# Patient Record
Sex: Male | Born: 1970 | Race: White | Hispanic: No | Marital: Single | Smoking: Never smoker
Health system: Southern US, Community
[De-identification: ages and names within clinical notes are randomized; demographics above are authoritative.]

## PROBLEM LIST (undated history)

## (undated) DIAGNOSIS — E785 Hyperlipidemia, unspecified: Secondary | ICD-10-CM

## (undated) DIAGNOSIS — S069XAA Unspecified intracranial injury with loss of consciousness status unknown, initial encounter: Secondary | ICD-10-CM

## (undated) DIAGNOSIS — M6281 Muscle weakness (generalized): Secondary | ICD-10-CM

## (undated) DIAGNOSIS — R061 Stridor: Secondary | ICD-10-CM

## (undated) DIAGNOSIS — I10 Essential (primary) hypertension: Secondary | ICD-10-CM

## (undated) DIAGNOSIS — F419 Anxiety disorder, unspecified: Secondary | ICD-10-CM

## (undated) DIAGNOSIS — S069X9A Unspecified intracranial injury with loss of consciousness of unspecified duration, initial encounter: Secondary | ICD-10-CM

## (undated) DIAGNOSIS — R1310 Dysphagia, unspecified: Secondary | ICD-10-CM

## (undated) DIAGNOSIS — J386 Stenosis of larynx: Secondary | ICD-10-CM

## (undated) HISTORY — PX: TRACHEOSTOMY: SUR1362

---

## 2016-07-04 ENCOUNTER — Encounter (HOSPITAL_COMMUNITY): Payer: Self-pay

## 2016-07-04 ENCOUNTER — Emergency Department (HOSPITAL_COMMUNITY)
Admission: EM | Admit: 2016-07-04 | Discharge: 2016-07-05 | Disposition: A | Discharge status: Home / Self Care | Payer: Medicare Other | Attending: Emergency Medicine | Admitting: Emergency Medicine

## 2016-07-04 DIAGNOSIS — J9509 Other tracheostomy complication: Secondary | ICD-10-CM | POA: Diagnosis present

## 2016-07-04 DIAGNOSIS — Z430 Encounter for attention to tracheostomy: Secondary | ICD-10-CM | POA: Insufficient documentation

## 2016-07-04 NOTE — ED Notes (Signed)
Bed: [id number] Expected date:  Expected time:  Means of arrival:  Comments: EMS trach irritation

## 2016-07-04 NOTE — ED Notes (Signed)
Per Respiratory, patient wants a new trach tonight

## 2016-07-04 NOTE — ED Triage Notes (Signed)
Patient arrives by EMS ambulatory in stable condition, verbal with no cough while speaking. [facility], called EMS because he has been coughing for 2 days and his trach is irritating him-previous trach for 3 years, this trach was placed in [place] 3 months ago. EMS states staff told patient that his trach was in the proper position.

## 2016-07-04 NOTE — ED Triage Notes (Signed)
EMS states patient was outside waiting for them

## 2016-07-04 NOTE — ED Provider Notes (Addendum)
[facility]-EMERGENCY DEPT Provider Note   CSN: [id number] Arrival date & time: 07/04/16  2150   By signing my name below, I, Theodore Knox, attest that this documentation has been prepared under the direction and in the presence of Tomasita CrumbleAdeleke Ghina Bittinger, MD. Electronically Signed: Soijett Knox, ED Scribe. 07/04/16. 11:28 PM.  History   Chief Complaint Chief Complaint  Patient presents with  . Trach Irritation    HPI Theodore Knox is a 46 y.o. male who presents to the Emergency Department complaining of trach irritation onset PTA. Pt reports associated cough x 2 days. Pt has not tried any medications for the relief of his symptoms. Pt notes that he had sensation of irritation and malalignment to his tracheostomy tube site due to his persistent cough and attempted to loosen the tracheostomy tube neck cuff PTA. He states that he came into the ED for tracheostomy tube replacement tonight. Pt reports that he had his tracheostomy tube placed 30 days ago and it is time for the tube to be replaced. Pt denies prior issues with his tracheostomy tube or having an ENT specialist in [place] due to recently moving to the area. He denies fever, chills, and any other symptoms.    The history is provided by the patient. No language interpreter was used.    History reviewed. No pertinent past medical history.  There are no active problems to display for this patient.   Past Surgical History:  Procedure Laterality Date  . TRACHEOSTOMY         Home Medications    Prior to Admission medications   Not on File    Family History No family history on file.  Social History Social History  Substance Use Topics  . Smoking status: Never Smoker  . Smokeless tobacco: Never Used  . Alcohol use No     Allergies   Patient has no known allergies.   Review of Systems Review of Systems A complete 10 system review of systems was obtained and all systems are negative except as noted in the HPI and PMH.     Physical Exam Updated Vital Signs BP 134/88 (BP Location: Left Arm)   Pulse 97   Temp 98.5 F (36.9 C) (Oral)   Resp 20   Ht 5\' 7"  (1.702 m)   Wt 190 lb (86.2 kg)   SpO2 96%   BMI 29.76 kg/m   Physical Exam  Constitutional: He is oriented to person, place, and time. Vital signs are normal. He appears well-developed and well-nourished.  Non-toxic appearance. He does not appear ill. No distress.  HENT:  Head: Normocephalic and atraumatic.  Nose: Nose normal.  Mouth/Throat: Oropharynx is clear and moist. No oropharyngeal exudate.  Eyes: Conjunctivae and EOM are normal. Pupils are equal, round, and reactive to light. No scleral icterus.  Neck: Normal range of motion. Neck supple. No tracheal deviation, no edema, no erythema and normal range of motion present. No thyroid mass and no thyromegaly present.  Trach in place. No surrounding erythema or signs of infection.  Cardiovascular: Normal rate, regular rhythm, S1 normal, S2 normal, normal heart sounds, intact distal pulses and normal pulses.  Exam reveals no gallop and no friction rub.   No murmur heard. Pulmonary/Chest: Effort normal. No respiratory distress. He has no wheezes. He has rhonchi. He has no rales.  Bilateral mild intermittent rhonchi  Abdominal: Soft. Normal appearance and bowel sounds are normal. He exhibits no distension, no ascites and no mass. There is no hepatosplenomegaly. There is no tenderness.  There is no rebound, no guarding and no CVA tenderness.  Musculoskeletal: Normal range of motion. He exhibits no edema or tenderness.  Lymphadenopathy:    He has no cervical adenopathy.  Neurological: He is alert and oriented to person, place, and time. He has normal strength. No cranial nerve deficit or sensory deficit.  Skin: Skin is warm, dry and intact. No petechiae and no rash noted. He is not diaphoretic. No erythema. No pallor.  Nursing note and vitals reviewed.    ED Treatments / Results  DIAGNOSTIC  STUDIES: Oxygen Saturation is 96% on RA, nl by my interpretation.    COORDINATION OF CARE: 11:20 PM Discussed treatment plan with pt at bedside which includes tracheostomy change and pt agreed to plan.   Radiology No results found.  Procedures TRACHEOSTOMY REPLACEMENT Date/Time: 07/26/2016 6:59 AM Performed by: Tomasita Crumble Authorized by: Tomasita Crumble  Consent: Verbal consent obtained. Risks and benefits: risks, benefits and alternatives were discussed Consent given by: patient Patient understanding: patient states understanding of the procedure being performed Procedure consent: procedure consent matches procedure scheduled Relevant documents: relevant documents present and verified Test results: test results available and properly labeled Site marked: the operative site was marked Imaging studies: imaging studies not available Patient identity confirmed: verbally with patient Indications: malfunction Local anesthesia used: no  Anesthesia: Local anesthesia used: no  Sedation: Patient sedated: no Patient tolerance: Patient tolerated the procedure well with no immediate complications    (including critical care time)  Medications Ordered in ED Medications - No data to display   Initial Impression / Assessment and Plan / ED Course  I have reviewed the triage vital signs and the nursing notes.  Pertinent imaging results that were available during my care of the patient were reviewed by me and considered in my medical decision making (see chart for details).     Patient presents to the ED for tracheostomy change.  He states he has a 30 day trach and it needs changing around this time. He does not have an ENT and is requesting a referral.  Will provide him one and attempt to change in the ED if respiratory care has the proper equipment.  Otherwise, he appears well and in NAD or resp distress.  VS have been normal.   12:30 AM Janina Mayo has been changed. Follow up provided.  Patient safe for DC.    Final Clinical Impressions(s) / ED Diagnoses   Final diagnoses:  None    New Prescriptions New Prescriptions   No medications on file      I personally performed the services described in this documentation, which was scribed in my presence. The recorded information has been reviewed and is accurate.      Tomasita Crumble, MD 07/05/16 4098    Tomasita Crumble, MD 07/26/16 0700

## 2016-07-05 DIAGNOSIS — Z430 Encounter for attention to tracheostomy: Secondary | ICD-10-CM | POA: Diagnosis not present

## 2016-07-05 NOTE — Procedures (Signed)
Bedside Tracheostomy Insertion Procedure Note   Patient Details:   Name: Theodore Knox DOB: 09/08/1970 MRN: [id number]  Procedure: Tracheostomy  Pre Procedure Assessment: Breath Sounds: Clear  Post Procedure Assessment: BP 134/88 (BP Location: Left Arm)   Pulse 97   Temp 98.5 F (36.9 C) (Oral)   Resp 20   Ht 5\' 7"  (1.702 m)   Wt 190 lb (86.2 kg)   SpO2 96%   BMI 29.76 kg/m  O2 sats: stable throughout Complications: No apparent complications Patient did tolerate procedure well Tracheostomy Brand:Shiley Tracheostomy Style:Proximal Tracheostomy Size: 6.0 XLT Proximal Tracheostomy Secured HYQ:MVHQIOvia:Velcro Tracheostomy Placement Confirmation: EZ CAP EtCO2 detector and visualization    Rulon EisenmengerJones, Cyndee Giammarco K 07/05/2016, 12:32 AM

## 2016-07-05 NOTE — ED Notes (Signed)
Respiratory called to change trach

## 2016-07-05 NOTE — ED Notes (Signed)
[facility] called for transportation home and [facility] given to [facility]

## 2016-07-23 ENCOUNTER — Emergency Department (HOSPITAL_COMMUNITY)
Admission: EM | Admit: 2016-07-23 | Discharge: 2016-07-23 | Disposition: A | Discharge status: Home / Self Care | Payer: Medicare Other | Attending: Emergency Medicine | Admitting: Emergency Medicine

## 2016-07-23 ENCOUNTER — Encounter (HOSPITAL_COMMUNITY): Payer: Self-pay | Admitting: Emergency Medicine

## 2016-07-23 DIAGNOSIS — J9500 Unspecified tracheostomy complication: Secondary | ICD-10-CM

## 2016-07-23 NOTE — ED Provider Notes (Signed)
[facility]-EMERGENCY DEPT Provider Note   CSN: [id number] Arrival date & time: 07/23/16  1050     History   Chief Complaint Chief Complaint  Patient presents with  . Tracheostomy Tube Change    HPI Warnie Belair is a 46 y.o. male. Chief complaint is tracheostomy difficulties. HPI:  Vision presents for evaluation of difficulties with his tracheostomy. He was cleaning it today. It had some congealed secretions. He is attempted to clean it. The inner cannula became wrinkled and is unable to replace it and presents here.  History reviewed. No pertinent past medical history.  There are no active problems to display for this patient.   Past Surgical History:  Procedure Laterality Date  . TRACHEOSTOMY         Home Medications    Prior to Admission medications   Not on File    Family History No family history on file.  Social History Social History  Substance Use Topics  . Smoking status: Never Smoker  . Smokeless tobacco: Never Used  . Alcohol use No     Allergies   Patient has no known allergies.   Review of Systems Review of Systems  Constitutional: Negative for appetite change, chills, diaphoresis, fatigue and fever.  HENT: Negative for mouth sores, sore throat and trouble swallowing.   Eyes: Negative for visual disturbance.  Respiratory: Negative for cough, chest tightness, shortness of breath and wheezing.        Normal secretions with his tracheostomy. Difficulty replacing the cannula.  Cardiovascular: Negative for chest pain.  Gastrointestinal: Negative for abdominal distention, abdominal pain, diarrhea, nausea and vomiting.  Endocrine: Negative for polydipsia, polyphagia and polyuria.  Genitourinary: Negative for dysuria, frequency and hematuria.  Musculoskeletal: Negative for gait problem.  Skin: Negative for color change, pallor and rash.  Neurological: Negative for dizziness, syncope, light-headedness and headaches.  Hematological: Does not  bruise/bleed easily.  Psychiatric/Behavioral: Negative for behavioral problems and confusion.     Physical Exam Updated Vital Signs BP 116/78 (BP Location: Left Arm)   Pulse 66   Temp 98.3 F (36.8 C) (Oral)   Resp 18   SpO2 92%   Physical Exam  Constitutional: He is oriented to person, place, and time. He appears well-developed and well-nourished. No distress.  HENT:  Head: Normocephalic.  Tracheostomy site appears intact.  Eyes: Conjunctivae are normal. Pupils are equal, round, and reactive to light. No scleral icterus.  Neck: Normal range of motion. Neck supple. No thyromegaly present.  Cardiovascular: Normal rate and regular rhythm.  Exam reveals no gallop and no friction rub.   No murmur heard. Pulmonary/Chest: Effort normal and breath sounds normal. No respiratory distress. He has no wheezes. He has no rales.  Abdominal: Soft. Bowel sounds are normal. He exhibits no distension. There is no tenderness. There is no rebound.  Musculoskeletal: Normal range of motion.  Neurological: He is alert and oriented to person, place, and time.  Skin: Skin is warm and dry. No rash noted.  Psychiatric: He has a normal mood and affect. His behavior is normal.     ED Treatments / Results  Labs (all labs ordered are listed, but only abnormal results are displayed) Labs Reviewed - No data to display  EKG  EKG Interpretation None       Radiology No results found.  Procedures Procedures (including critical care time)  Medications Ordered in ED Medications - No data to display   Initial Impression / Assessment and Plan / ED Course  I have reviewed  the triage vital signs and the nursing notes.  Pertinent labs & imaging results that were available during my care of the patient were reviewed by me and considered in my medical decision making (see chart for details).    Tracheostomy suctioned and inner cannula placed without difficulty. Appropriate for discharge home.  Final  Clinical Impressions(s) / ED Diagnoses   Final diagnoses:  Complication of tracheostomy tube [facility](HCC)    New Prescriptions New Prescriptions   No medications on file     Rolland PorterMark Mak Bonny, MD 07/23/16 1225

## 2016-07-23 NOTE — ED Triage Notes (Signed)
Patient presents today with needing a new inner Trach canula. Patient reports its a (6) XL shiely. Patient states he was changing at home and "ran into issues". Patient denies any Chest pain. Patient states he has SOB due to the canula. Patient alert and oriented x4. Patient ambulatory.

## 2016-08-07 ENCOUNTER — Other Ambulatory Visit (HOSPITAL_COMMUNITY): Payer: Self-pay | Admitting: Nurse Practitioner

## 2016-08-09 ENCOUNTER — Other Ambulatory Visit (HOSPITAL_COMMUNITY): Payer: Self-pay | Admitting: Registered Nurse

## 2016-08-09 DIAGNOSIS — R1310 Dysphagia, unspecified: Secondary | ICD-10-CM

## 2016-08-13 ENCOUNTER — Emergency Department (HOSPITAL_COMMUNITY): Payer: Medicare Other

## 2016-08-13 ENCOUNTER — Emergency Department (HOSPITAL_COMMUNITY)
Admission: EM | Admit: 2016-08-13 | Discharge: 2016-08-13 | Disposition: A | Discharge status: Home / Self Care | Payer: Medicare Other | Attending: Emergency Medicine | Admitting: Emergency Medicine

## 2016-08-13 ENCOUNTER — Encounter (HOSPITAL_COMMUNITY): Payer: Self-pay | Admitting: Emergency Medicine

## 2016-08-13 DIAGNOSIS — J9509 Other tracheostomy complication: Secondary | ICD-10-CM | POA: Insufficient documentation

## 2016-08-13 DIAGNOSIS — J9500 Unspecified tracheostomy complication: Secondary | ICD-10-CM

## 2016-08-13 MED ORDER — CLOMIPRAMINE HCL 25 MG PO CAPS
50.0000 mg | ORAL_CAPSULE | Freq: Once | ORAL | Status: AC
  Administered 2016-08-13: 50 mg via ORAL
  Filled 2016-08-13: qty 2

## 2016-08-13 MED ORDER — LIDOCAINE HCL 2 % EX GEL
1.0000 "application " | Freq: Once | CUTANEOUS | Status: AC
  Administered 2016-08-13: 1 via TOPICAL
  Filled 2016-08-13: qty 20

## 2016-08-13 MED ORDER — OLANZAPINE 5 MG PO TABS
5.0000 mg | ORAL_TABLET | Freq: Every day | ORAL | Status: DC
  Administered 2016-08-13: 5 mg via ORAL
  Filled 2016-08-13 (×2): qty 1

## 2016-08-13 NOTE — ED Notes (Signed)
Report given to nursing staff at [facility]; staff states to send patient home in taxi

## 2016-08-13 NOTE — ED Provider Notes (Signed)
[facility]-EMERGENCY DEPT Provider Note   CSN: [id number] Arrival date & time: 08/13/16  1625     History   Chief Complaint Chief Complaint  Patient presents with  . Tracheostomy Tube Change    HPI Quantay Zaremba is a 46 y.o. male.  HPI 45 year old male with history of tracheostomy placed in December 2017 here with dislodgment of endotracheal tube. Patient was just seen last month for similar symptoms. He states he was eating today when he accidentally choked and coughed forcefully. The endotracheal tube was then removed accidentally. He attempted to replace it but was unable to do so. Currently, he denies any shortness of breath. Denies any pain or redness around his tracheostomy site. Denies any recent drainage. He was in his usual state of health prior to the accidental dislodgment.  History reviewed. No pertinent past medical history.  There are no active problems to display for this patient.   Past Surgical History:  Procedure Laterality Date  . TRACHEOSTOMY         Home Medications    Prior to Admission medications   Medication Sig Start Date End Date Taking? Authorizing Provider  acetaminophen (TYLENOL) 500 MG tablet Take 1,000 mg by mouth 2 (two) times daily as needed (for pain, headaches, or fever).   Yes Historical Provider, MD  ARIPiprazole (ABILIFY) 15 MG tablet Take 15 mg by mouth daily.   Yes Historical Provider, MD  atorvastatin (LIPITOR) 40 MG tablet Take 40 mg by mouth daily.   Yes Historical Provider, MD  clomiPRAMINE (ANAFRANIL) 50 MG capsule Take 100 mg by mouth daily.   Yes Historical Provider, MD  diphenhydrAMINE (BENADRYL) 25 mg capsule Take 25 mg by mouth at bedtime as needed for sleep.   Yes Historical Provider, MD  lithium carbonate 300 MG capsule Take 300 mg by mouth every morning.   Yes Historical Provider, MD  Melatonin 3 MG TABS Take 6 mg by mouth at bedtime.   Yes Historical Provider, MD  metoprolol succinate (TOPROL-XL) 100 MG 24 hr tablet Take 100  mg by mouth daily. Take with or immediately following a meal.   Yes Historical Provider, MD  nystatin cream (MYCOSTATIN) Apply 1 application topically See admin instructions. TO BE APPLIED TO THE AFFECTED AREA(S) DAILY   Yes Historical Provider, MD  OLANZapine (ZYPREXA) 5 MG tablet Take 5 mg by mouth 2 (two) times daily.   Yes Historical Provider, MD  Omega-3 Fatty Acids (FISH OIL) 1000 MG CAPS Take 1,000 mg by mouth daily.   Yes Historical Provider, MD  oxybutynin (DITROPAN) 5 MG tablet Take 5 mg by mouth daily.   Yes Historical Provider, MD    Family History No family history on file.  Social History Social History  Substance Use Topics  . Smoking status: Never Smoker  . Smokeless tobacco: Never Used  . Alcohol use No     Allergies   Patient has no known allergies.   Review of Systems Review of Systems  Constitutional: Negative for chills, fatigue and fever.  HENT: Negative for congestion and rhinorrhea.   Eyes: Negative for visual disturbance.  Respiratory: Negative for cough, shortness of breath and wheezing.   Cardiovascular: Negative for chest pain and leg swelling.  Gastrointestinal: Negative for abdominal pain, diarrhea, nausea and vomiting.  Genitourinary: Negative for dysuria and flank pain.  Musculoskeletal: Negative for neck pain and neck stiffness.  Skin: Negative for rash and wound.  Allergic/Immunologic: Negative for immunocompromised state.  Neurological: Negative for syncope, weakness and headaches.  All  other systems reviewed and are negative.    Physical Exam Updated Vital Signs BP (!) 129/95   Pulse 87   Temp 98.5 F (36.9 C) (Oral)   Resp 18   SpO2 93%   Physical Exam  Constitutional: He is oriented to person, place, and time. He appears well-developed and well-nourished. No distress.  HENT:  Head: Normocephalic and atraumatic.  Eyes: Conjunctivae are normal.  Neck: Neck supple.  Tracheostomy site pink and patent. There is a small area of  granulation tissue at approximately 10 to 11:00. No active bleeding. No stridor. Phonation is hoarse but audible.  Cardiovascular: Normal rate, regular rhythm and normal heart sounds.  Exam reveals no friction rub.   No murmur heard. Pulmonary/Chest: Effort normal and breath sounds normal. No respiratory distress. He has no wheezes. He has no rales.  Abdominal: He exhibits no distension.  Musculoskeletal: He exhibits no edema.  Neurological: He is alert and oriented to person, place, and time. He exhibits normal muscle tone.  Skin: Skin is warm. Capillary refill takes less than 2 seconds.  Psychiatric: He has a normal mood and affect.  Nursing note and vitals reviewed.    ED Treatments / Results  Labs (all labs ordered are listed, but only abnormal results are displayed) Labs Reviewed - No data to display  EKG  EKG Interpretation None       Radiology Dg Chest 2 View  Result Date: 08/13/2016 CLINICAL DATA:  Coughing today while heating and tracheostomy came out. Dr. put Tracheostomy back in place but patient states it is not smaller. EXAM: CHEST  2 VIEW COMPARISON:  None. FINDINGS: Tracheostomy appears in adequate position. Lungs are adequately inflated without focal consolidation or effusion. Cardiomediastinal silhouette is within normal. There mild degenerate changes of the spine. IMPRESSION: No acute cardiopulmonary disease. Tracheostomy tube in adequate position. Electronically Signed   By: Elberta Fortis M.D.   On: 08/13/2016 21:51    Procedures Procedures (including critical care time)  Medications Ordered in ED Medications  lidocaine (XYLOCAINE) 2 % jelly 1 application (1 application Topical Given 08/13/16 1807)  clomiPRAMINE (ANAFRANIL) capsule 50 mg (50 mg Oral Given 08/13/16 2307)     Initial Impression / Assessment and Plan / ED Course  I have reviewed the triage vital signs and the nursing notes.  Pertinent labs & imaging results that were available during my care of  the patient were reviewed by me and considered in my medical decision making (see chart for details).     46 year old male here with accidental dislodgment of tracheostomy tube. Unable to replace with 6-0 and 4-0 at bedside by myself and RT. Consulted Dr. Pollyann Kennedy with ENT, who dilated tracheostomy at bedside and was able to place 4-0 cuffless Shiley. Pt well appearing, tolerating PO, with normal WOB after placement. XR unremarkable. D/c with outpt follow-up. Updated pt, pt's mother, and will send back to ALF.  Final Clinical Impressions(s) / ED Diagnoses   Final diagnoses:  Complication of tracheostomy tube [facility])    New Prescriptions Discharge Medication List as of 08/13/2016 10:44 PM       Shaune Pollack, MD 08/14/16 [phone]

## 2016-08-13 NOTE — Discharge Instructions (Signed)
-  Trach tube was replaced with a 4-0 Shiley. Theodore Knox needs to follow up with Dr. Pollyann Kennedy in 1 week. -He is due for his melatonin. He has been given his other night-time medications.

## 2016-08-13 NOTE — ED Notes (Signed)
Pt in room completely dressed; pt requesting results of CXR and doses of nightly medications

## 2016-08-13 NOTE — ED Notes (Signed)
Pt out of room several time to nursing station asking for nightly doses of medications

## 2016-08-13 NOTE — ED Notes (Signed)
Secretary reqeusted pt trach size from the central supply.

## 2016-08-13 NOTE — Consult Note (Signed)
  Reason for Consult: Tracheostomy fell out Referring Physician: Shaune Pollack, MD  Theodore Knox is an 46 y.o. male.  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 As far as he knows he does not have any other endoscopic treatment. Etiology of the stenosis is unknown. He had some trouble with choking on food earlier and his tracheostomy came out. Nobody was able to put it back in.  History reviewed. No pertinent past medical history.  Past Surgical History:  Procedure Laterality Date  . TRACHEOSTOMY      No family history on file.  Social History:  reports that he has never smoked. He has never used smokeless tobacco. He reports that he does not drink alcohol or use drugs.  Allergies: No Known Allergies  Medications: Reviewed  No results found for this or any previous visit (from the past 48 hour(s)).  No results found.  WUJ:[id number] except as listed in admit H&P  Blood pressure 129/73, pulse 79, temperature 98.5 F (36.9 C), temperature source Oral, resp. rate 20, SpO2 94 %.  PHYSICAL EXAM: Overall appearance:  Healthy appearing, in no distress Head:  Normocephalic, atraumatic. Ears: External ears look healthy. Nose: External nose is healthy in appearance. Internal nasal exam free of any lesions or obstruction. Oral Cavity/Pharynx:  There are no mucosal lesions or masses identified. Larynx/Hypopharynx: Deferred Neuro:  No identifiable neurologic deficits. Neck: No palpable neck masses. Tracheostomy site stenotic. I was able to dilate with urethral sounds and then was able to replace with a #4 uncuffed Shiley. There was minor bleeding. He tolerated this well but had some coughing.  Studies Reviewed: none  Procedures: Tracheostomy was replaced after dilating the stoma.   Assessment/Plan: Subglottic stenosis, tracheostomy dependent, tracheostomy replaced with a #4 Shiley uncuffed. Recommend we keep this in for  about a week and let everything settle down and then we will see him in the office to discuss further intervention.  Theodore Knox 08/13/2016, 8:02 PM

## 2016-08-13 NOTE — ED Triage Notes (Addendum)
Received pt from [facility] with c/o 1 hour PTA, while eating some chocolate candy,  pt coughed and his trach came out. Pt able to talk in complete sentences. Pt uses shiley 6 XLT. Pt reports cuffless trach will not work

## 2016-08-14 ENCOUNTER — Ambulatory Visit (HOSPITAL_COMMUNITY)
Admission: RE | Admit: 2016-08-14 | Discharge: 2016-08-14 | Disposition: A | Discharge status: Home / Self Care | Payer: Medicare Other | Source: Ambulatory Visit | Attending: Registered Nurse | Admitting: Registered Nurse

## 2016-08-14 DIAGNOSIS — R1310 Dysphagia, unspecified: Secondary | ICD-10-CM | POA: Diagnosis present

## 2016-08-24 ENCOUNTER — Emergency Department (HOSPITAL_COMMUNITY)
Admission: EM | Admit: 2016-08-24 | Discharge: 2016-08-24 | Disposition: A | Discharge status: Home / Self Care | Payer: Medicare Other | Attending: Emergency Medicine | Admitting: Emergency Medicine

## 2016-08-24 ENCOUNTER — Encounter (HOSPITAL_COMMUNITY): Payer: Self-pay | Admitting: Emergency Medicine

## 2016-08-24 DIAGNOSIS — Z79899 Other long term (current) drug therapy: Secondary | ICD-10-CM | POA: Diagnosis not present

## 2016-08-24 DIAGNOSIS — S3992XA Unspecified injury of lower back, initial encounter: Secondary | ICD-10-CM | POA: Diagnosis present

## 2016-08-24 DIAGNOSIS — Y939 Activity, unspecified: Secondary | ICD-10-CM | POA: Insufficient documentation

## 2016-08-24 DIAGNOSIS — X58XXXA Exposure to other specified factors, initial encounter: Secondary | ICD-10-CM | POA: Insufficient documentation

## 2016-08-24 DIAGNOSIS — S39012A Strain of muscle, fascia and tendon of lower back, initial encounter: Secondary | ICD-10-CM

## 2016-08-24 DIAGNOSIS — I10 Essential (primary) hypertension: Secondary | ICD-10-CM | POA: Diagnosis not present

## 2016-08-24 DIAGNOSIS — Y999 Unspecified external cause status: Secondary | ICD-10-CM | POA: Insufficient documentation

## 2016-08-24 DIAGNOSIS — M545 Low back pain, unspecified: Secondary | ICD-10-CM

## 2016-08-24 DIAGNOSIS — Y929 Unspecified place or not applicable: Secondary | ICD-10-CM | POA: Insufficient documentation

## 2016-08-24 HISTORY — DX: Essential (primary) hypertension: I10

## 2016-08-24 MED ORDER — KETOROLAC TROMETHAMINE 60 MG/2ML IM SOLN
60.0000 mg | Freq: Once | INTRAMUSCULAR | Status: AC
  Administered 2016-08-24: 60 mg via INTRAMUSCULAR
  Filled 2016-08-24: qty 2

## 2016-08-24 MED ORDER — METHOCARBAMOL 500 MG PO TABS
500.0000 mg | ORAL_TABLET | Freq: Once | ORAL | Status: AC
  Administered 2016-08-24: 500 mg via ORAL
  Filled 2016-08-24: qty 1

## 2016-08-24 MED ORDER — METHOCARBAMOL 500 MG PO TABS: 500.0000 mg | ORAL_TABLET | Freq: Three times a day (TID) | ORAL | 0 refills | Status: DC | PRN

## 2016-08-24 MED ORDER — OXYCODONE-ACETAMINOPHEN 5-325 MG PO TABS
1.0000 | ORAL_TABLET | Freq: Once | ORAL | Status: AC
  Administered 2016-08-24: 1 via ORAL
  Filled 2016-08-24: qty 1

## 2016-08-24 MED ORDER — IBUPROFEN 600 MG PO TABS: 600.0000 mg | ORAL_TABLET | Freq: Three times a day (TID) | ORAL | 0 refills | Status: DC | PRN

## 2016-08-24 NOTE — ED Triage Notes (Addendum)
Patient from home ([facility]) for lower back pain that was present on waking this morning.  Patient states he believes it is related to unusual exertion yesterday sitting and walking more than normal.  Patient moves all extremities without difficulty, but states it is too painful to stand and walk.  EMS reports patient was able to stand and pivot on scene, patient is ambulatory at baseline.  Tracheostomy in place and patent.  Patient alert and oriented and in no apparent distress at this time.  Patient states he took  tylenol at 0730 prior to EMS arrival that changed his pain from 10/10 to 6/10.
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
## 2016-09-11 ENCOUNTER — Emergency Department (HOSPITAL_COMMUNITY)
Admission: EM | Admit: 2016-09-11 | Discharge: 2016-09-11 | Disposition: A | Discharge status: Home / Self Care | Payer: Medicare Other | Attending: Emergency Medicine | Admitting: Emergency Medicine

## 2016-09-11 ENCOUNTER — Encounter (HOSPITAL_COMMUNITY): Payer: Self-pay | Admitting: Emergency Medicine

## 2016-09-11 DIAGNOSIS — I10 Essential (primary) hypertension: Secondary | ICD-10-CM | POA: Insufficient documentation

## 2016-09-11 DIAGNOSIS — Z79899 Other long term (current) drug therapy: Secondary | ICD-10-CM | POA: Insufficient documentation

## 2016-09-11 DIAGNOSIS — J9500 Unspecified tracheostomy complication: Secondary | ICD-10-CM | POA: Insufficient documentation

## 2016-09-11 HISTORY — DX: Hyperlipidemia, unspecified: E78.5

## 2016-09-11 HISTORY — DX: Anxiety disorder, unspecified: F41.9

## 2016-09-11 NOTE — ED Notes (Signed)
Called for [facility] transport. 

## 2016-09-11 NOTE — ED Notes (Signed)
Per ER staff, patient left ER with [facility].

## 2016-09-11 NOTE — ED Notes (Signed)
Went over discharge paperwork with patient and his mother.  Both verbalized understanding of discharge instructions.

## 2016-09-11 NOTE — ED Triage Notes (Signed)
Per [facility] pt from [facility] [place] requesting trach to be changed.

## 2016-09-11 NOTE — ED Notes (Signed)
Bed: [facility] Expected date:  Expected time:  Means of arrival:  Comments: 

## 2016-09-11 NOTE — ED Provider Notes (Signed)
[facility]-EMERGENCY DEPT Provider Note   CSN: [id number] Arrival date & time: 09/11/16  1409     History   Chief Complaint Chief Complaint  Patient presents with  . Trach Changed    HPI Theodore Knox is a 46 y.o. male.  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  He presents emergency department today because a person in the community and noted that the patient had some redness around his trach and recommended they come to the ER for evaluation.  Patient has no complaints at this time.  He denies shortness of breath.  He reports no cough with his trach.  He states he cannot visualize the area and therefore he came to [place] to have it evaluated.  No new secretions.  No fevers or chills.  No other complaints.  Of note he was seen in emergency department the beginning of April after his 6-0 Shiley uncuffed came out and this was unable to be replaced by the staff in the emergency department.  Dr. Pollyann Kennedy with ENT came in to see and evaluate the patient dilated his stoma and placed a 4-0 Shiley.  The patient was supposed to follow-up with Dr. Pollyann Kennedy in the clinic in one week and he has never followed up.  He currently does not have anyone managing his trach as this was originally placed [facility]   Past Medical History:  Diagnosis Date  . Anxiety   . Hyperlipemia   . Hypertension     There are no active problems to display for this patient.   Past Surgical History:  Procedure Laterality Date  . TRACHEOSTOMY         Home Medications    Prior to Admission medications   Medication Sig Start Date End Date Taking? Authorizing Provider  acetaminophen (TYLENOL) 500 MG tablet Take 1,000 mg by mouth 2 (two) times daily as needed (for pain, headaches, or fever).    [provider]  ARIPiprazole (ABILIFY) 15 MG tablet Take 15 mg by mouth daily.    [provider]  atorvastatin (LIPITOR) 40 MG tablet Take 40 mg by mouth daily.    [provider]  clomiPRAMINE (ANAFRANIL) 50 MG capsule Take 100 mg by mouth daily.    [provider]  diphenhydrAMINE (BENADRYL) 25 mg capsule Take 25 mg by mouth at bedtime as needed for sleep.    [provider]  ibuprofen (ADVIL,MOTRIN) 600 MG tablet Take 1 tablet (600 mg total) by mouth every 8 (eight) hours as needed. 08/24/16   [name], MD  lithium carbonate 300 MG capsule Take 300 mg by mouth every morning.    [provider]  Melatonin 3 MG TABS Take 6 mg by mouth at bedtime.    [provider]  methocarbamol (ROBAXIN) 500 MG tablet Take 1 tablet (500 mg total) by mouth every 8 (eight) hours as needed for muscle spasms. 08/24/16   [name], MD  metoprolol succinate (TOPROL-XL) 100 MG 24 hr tablet Take 100 mg by mouth daily. Take with or immediately following a meal.    [provider]  nystatin cream (MYCOSTATIN) Apply 1 application topically See admin instructions. TO BE APPLIED TO THE AFFECTED AREA(S) DAILY    [provider]  OLANZapine (ZYPREXA) 5 MG tablet Take 5 mg by mouth 2 (two) times daily.    [provider]  Omega-3 Fatty Acids (FISH OIL) 1000 MG CAPS Take 1,000 mg by mouth daily.    [provider]  oxybutynin (DITROPAN) 5 MG tablet Take 5 mg by mouth daily.    [provider]    Family History No family history on file.  Social History Social History  Substance Use Topics  . Smoking status: Never Smoker  . Smokeless tobacco: Never Used  . Alcohol use No     Allergies   Patient has no known allergies.   Review of Systems Review of Systems  All other systems reviewed and are negative.    Physical Exam Updated Vital Signs BP 132/85 (BP Location: Right Arm)   Pulse 99   Temp 98.4 F (36.9 C) (Oral)   Resp 18   Wt 190 lb (86.2 kg)   SpO2 93%   BMI 29.76 kg/m   Physical Exam  Constitutional: He is oriented to person, place, and time. He appears well-developed and  well-nourished.  HENT:  Head: Normocephalic.  Eyes: EOM are normal.  Neck: Normal range of motion. Neck supple.  4-0 Shiley uncuffed trach in place.  No significant surrounding erythema or discharge  Pulmonary/Chest: Effort normal.  Abdominal: He exhibits no distension.  Musculoskeletal: Normal range of motion.  Neurological: He is alert and oriented to person, place, and time.  Psychiatric: He has a normal mood and affect.  Nursing note and vitals reviewed.    ED Treatments / Results  Labs (all labs ordered are listed, but only abnormal results are displayed) Labs Reviewed - No data to display  EKG  EKG Interpretation None       Radiology No results found.  Procedures Procedures (including critical care time)  Medications Ordered in ED Medications - No data to display   Initial Impression / Assessment and Plan / ED Course  I have reviewed the triage vital signs and the nursing notes.  Pertinent labs & imaging results that were available during my care of the patient were reviewed by me and considered in my medical decision making (see chart for details).     No indication for exchange of his tracheostomy today.  No overt signs of surrounding infection or tracheitis.  Patient will be discharged home safely from the emergency department this time.  I've asked that he follow-up with ENT as originally instructed  Final Clinical Impressions(s) / ED Diagnoses   Final diagnoses:  Tracheostomy complication, unspecified complication type [facility](HCC)    New Prescriptions New Prescriptions   No medications on file     Azalia Bilisampos, Tamiki Kuba, MD 09/11/16 [phone]

## 2016-09-13 ENCOUNTER — Encounter (HOSPITAL_COMMUNITY): Payer: Self-pay | Admitting: *Deleted

## 2016-09-13 NOTE — Pre-Procedure Instructions (Signed)
    Theodore Knox  09/13/2016     No Pharmacies Listed   Your procedure is scheduled on Thursday, Sep 14, 2016  [facility] at 11:20 A.M.  Call this number if you have problems the morning of surgery:  [phone]   Remember:  Do not eat food or drink liquids after midnight.  Take these medicines the morning of surgery with A SIP OF WATER :ARIPiprazole (ABILIFY), clomiPRAMINE (ANAFRANIL), lithium carbonate, metoprolol succinate (TOPROL-XL), OLANZapine (ZYPREXA) , if needed: acetaminophen (TYLENOL)   Stop taking Aspirin, vitamins, fish oil, Melatonin and herbal medications. Do not take any NSAIDs ie: Ibuprofen, Advil, Naproxen, BC and Goody Powder or any medication containing Aspirin; stop now.   Do not wear jewelry, make-up or nail polish.  Do not wear lotions, powders, or perfumes, or deoderant.  Do not shave 48 hours prior to surgery.  Men may shave face and neck.  Do not bring valuables to the hospital.  [facility] is not responsible for any belongings or valuables.  Contacts, dentures or bridgework may not be worn into surgery.  Leave your suitcase in the car.  After surgery it may be brought to your room.  For patients admitted to the hospital, discharge time will be determined by your treatment team.  Patients discharged the day of surgery will not be allowed to drive home.   Please read over the following fact sheets that you were given.

## 2016-09-13 NOTE — Progress Notes (Signed)
Pt SDW-Pre-op call completed by Lurena Joinerebecca, Patient Care Director at [facility] [place]. Please complete Anesthesia Assessment, Apnea Screening and Cardiac history with pt on DOS. Requested [facility] tracing from [facility].

## 2016-09-13 NOTE — Progress Notes (Signed)
Lurena JoinerRebecca confirmed receipt of fax and verbalized understanding of all pre-op instructions.

## 2016-09-14 ENCOUNTER — Ambulatory Visit (HOSPITAL_COMMUNITY): Payer: Medicare Other | Admitting: Certified Registered Nurse Anesthetist

## 2016-09-14 ENCOUNTER — Encounter (HOSPITAL_COMMUNITY): Payer: Self-pay | Admitting: *Deleted

## 2016-09-14 ENCOUNTER — Ambulatory Visit (HOSPITAL_COMMUNITY)
Admission: RE | Admit: 2016-09-14 | Discharge: 2016-09-14 | Disposition: A | Discharge status: Home / Self Care | Payer: Medicare Other | Source: Ambulatory Visit | Attending: Otolaryngology | Admitting: Otolaryngology

## 2016-09-14 ENCOUNTER — Encounter (HOSPITAL_COMMUNITY)
Admission: RE | Disposition: A | Discharge status: Home / Self Care | Payer: Self-pay | Source: Ambulatory Visit | Attending: Otolaryngology

## 2016-09-14 DIAGNOSIS — Z930 Tracheostomy status: Secondary | ICD-10-CM | POA: Diagnosis not present

## 2016-09-14 DIAGNOSIS — Z79899 Other long term (current) drug therapy: Secondary | ICD-10-CM | POA: Insufficient documentation

## 2016-09-14 DIAGNOSIS — I10 Essential (primary) hypertension: Secondary | ICD-10-CM | POA: Diagnosis not present

## 2016-09-14 DIAGNOSIS — E785 Hyperlipidemia, unspecified: Secondary | ICD-10-CM | POA: Insufficient documentation

## 2016-09-14 DIAGNOSIS — J398 Other specified diseases of upper respiratory tract: Secondary | ICD-10-CM | POA: Diagnosis not present

## 2016-09-14 HISTORY — DX: Stridor: R06.1

## 2016-09-14 HISTORY — DX: Unspecified intracranial injury with loss of consciousness status unknown, initial encounter: S06.9XAA

## 2016-09-14 HISTORY — DX: Unspecified intracranial injury with loss of consciousness of unspecified duration, initial encounter: S06.9X9A

## 2016-09-14 HISTORY — DX: Muscle weakness (generalized): M62.81

## 2016-09-14 HISTORY — PX: TRACHEAL DILITATION: SHX5068

## 2016-09-14 HISTORY — DX: Stenosis of larynx: J38.6

## 2016-09-14 HISTORY — DX: Dysphagia, unspecified: R13.10

## 2016-09-14 LAB — CBC
HCT: 45.7 % (ref 39.0–52.0)
HEMOGLOBIN: 15.4 g/dL (ref 13.0–17.0)
MCH: 29.5 pg (ref 26.0–34.0)
MCHC: 33.7 g/dL (ref 30.0–36.0)
MCV: 87.5 fL (ref 78.0–100.0)
Platelets: 184 10*3/uL (ref 150–400)
RBC: 5.22 MIL/uL (ref 4.22–5.81)
RDW: 12.3 % (ref 11.5–15.5)
WBC: 8 10*3/uL (ref 4.0–10.5)

## 2016-09-14 LAB — BASIC METABOLIC PANEL
Anion gap: 11 (ref 5–15)
BUN: 11 mg/dL (ref 6–20)
CHLORIDE: 105 mmol/L (ref 101–111)
CO2: 24 mmol/L (ref 22–32)
Calcium: 9.2 mg/dL (ref 8.9–10.3)
Creatinine, Ser: 0.76 mg/dL (ref 0.61–1.24)
GFR calc non Af Amer: >60 mL/min (ref >60)
Glucose, Bld: 81 mg/dL (ref 65–99)
POTASSIUM: 3.9 mmol/L (ref 3.5–5.1)
SODIUM: 140 mmol/L (ref 135–145)

## 2016-09-14 SURGERY — DILATION, TRACHEA
Anesthesia: General | Site: Neck

## 2016-09-14 MED ORDER — FENTANYL CITRATE (PF) 250 MCG/5ML IJ SOLN
INTRAMUSCULAR | Status: AC
  Filled 2016-09-14: qty 5

## 2016-09-14 MED ORDER — PROPOFOL 10 MG/ML IV BOLUS
INTRAVENOUS | Status: DC | PRN
  Administered 2016-09-14: 70 mg via INTRAVENOUS
  Administered 2016-09-14: 130 mg via INTRAVENOUS

## 2016-09-14 MED ORDER — MIDAZOLAM HCL 2 MG/2ML IJ SOLN
INTRAMUSCULAR | Status: AC
  Filled 2016-09-14: qty 2

## 2016-09-14 MED ORDER — FENTANYL CITRATE (PF) 100 MCG/2ML IJ SOLN
INTRAMUSCULAR | Status: DC | PRN
  Administered 2016-09-14: 50 ug via INTRAVENOUS

## 2016-09-14 MED ORDER — LACTATED RINGERS IV SOLN
INTRAVENOUS | Status: DC
  Administered 2016-09-14: 10 mL/h via INTRAVENOUS

## 2016-09-14 MED ORDER — HYDROMORPHONE HCL 1 MG/ML IJ SOLN: 0.2500 mg | INTRAMUSCULAR | Status: DC | PRN

## 2016-09-14 MED ORDER — ONDANSETRON HCL 4 MG/2ML IJ SOLN: 4.0000 mg | Freq: Once | INTRAMUSCULAR | Status: DC | PRN

## 2016-09-14 MED ORDER — MEPERIDINE HCL 25 MG/ML IJ SOLN: 6.2500 mg | INTRAMUSCULAR | Status: DC | PRN

## 2016-09-14 MED ORDER — 0.9 % SODIUM CHLORIDE (POUR BTL) OPTIME
TOPICAL | Status: DC | PRN
  Administered 2016-09-14: 1000 mL

## 2016-09-14 MED ORDER — PROPOFOL 500 MG/50ML IV EMUL
INTRAVENOUS | Status: DC | PRN
  Administered 2016-09-14: 200 ug/kg/min via INTRAVENOUS

## 2016-09-14 MED ORDER — ROCURONIUM BROMIDE 10 MG/ML (PF) SYRINGE
PREFILLED_SYRINGE | INTRAVENOUS | Status: AC
  Filled 2016-09-14: qty 5

## 2016-09-14 MED ORDER — LIDOCAINE 2% (20 MG/ML) 5 ML SYRINGE
INTRAMUSCULAR | Status: AC
  Filled 2016-09-14: qty 5

## 2016-09-14 MED ORDER — MIDAZOLAM HCL 5 MG/5ML IJ SOLN
INTRAMUSCULAR | Status: DC | PRN
  Administered 2016-09-14: 2 mg via INTRAVENOUS

## 2016-09-14 MED ORDER — LIDOCAINE-EPINEPHRINE 1 %-1:100000 IJ SOLN
INTRAMUSCULAR | Status: AC
  Filled 2016-09-14: qty 1

## 2016-09-14 MED ORDER — PROPOFOL 10 MG/ML IV BOLUS
INTRAVENOUS | Status: AC
  Filled 2016-09-14: qty 20

## 2016-09-14 SURGICAL SUPPLY — 37 items
BENZOIN TINCTURE PRP APPL 2/3 (GAUZE/BANDAGES/DRESSINGS) IMPLANT
BLADE CLIPPER SURG (BLADE) IMPLANT
BLADE SURG 15 STRL LF DISP TIS (BLADE) ×1 IMPLANT
BLADE SURG 15 STRL SS (BLADE) ×2
CANISTER SUCT 3000ML PPV (MISCELLANEOUS) ×3 IMPLANT
CLEANER TIP ELECTROSURG 2X2 (MISCELLANEOUS) ×3 IMPLANT
COVER SURGICAL LIGHT HANDLE (MISCELLANEOUS) ×3 IMPLANT
DECANTER SPIKE VIAL GLASS SM (MISCELLANEOUS) IMPLANT
DRAPE HALF SHEET 40X57 (DRAPES) IMPLANT
ELECT COATED BLADE 2.86 ST (ELECTRODE) ×3 IMPLANT
ELECT REM PT RETURN 9FT ADLT (ELECTROSURGICAL) ×3
ELECTRODE REM PT RTRN 9FT ADLT (ELECTROSURGICAL) ×1 IMPLANT
GAUZE SPONGE 4X4 16PLY XRAY LF (GAUZE/BANDAGES/DRESSINGS) ×3 IMPLANT
GLOVE ECLIPSE 7.5 STRL STRAW (GLOVE) ×3 IMPLANT
GLOVE SURG SS PI 6.5 STRL IVOR (GLOVE) ×3 IMPLANT
GOWN STRL REUS W/ TWL LRG LVL3 (GOWN DISPOSABLE) ×2 IMPLANT
GOWN STRL REUS W/TWL LRG LVL3 (GOWN DISPOSABLE) ×4
KIT BASIN OR (CUSTOM PROCEDURE TRAY) ×3 IMPLANT
KIT ROOM TURNOVER OR (KITS) ×3 IMPLANT
NEEDLE PRECISIONGLIDE 27X1.5 (NEEDLE) ×3 IMPLANT
NS IRRIG 1000ML POUR BTL (IV SOLUTION) ×3 IMPLANT
PACK EENT II TURBAN DRAPE (CUSTOM PROCEDURE TRAY) ×3 IMPLANT
PAD ARMBOARD 7.5X6 YLW CONV (MISCELLANEOUS) ×6 IMPLANT
PENCIL FOOT CONTROL (ELECTRODE) ×3 IMPLANT
SUT CHROMIC 2 0 SH (SUTURE) IMPLANT
SUT ETHILON 3 0 PS 1 (SUTURE) IMPLANT
SUT SILK 4 0 (SUTURE)
SUT SILK 4 0 TIE 10X30 (SUTURE) IMPLANT
SUT SILK 4-0 18XBRD TIE 12 (SUTURE) IMPLANT
SYR 20CC LL (SYRINGE) ×3 IMPLANT
SYR CONTROL 10ML LL (SYRINGE) IMPLANT
TOWEL OR 17X24 6PK STRL BLUE (TOWEL DISPOSABLE) ×3 IMPLANT
TUBE CONNECTING 12'X1/4 (SUCTIONS) ×1
TUBE CONNECTING 12X1/4 (SUCTIONS) ×2 IMPLANT
TUBE TRACH 6 EXL  PROX UNCUF (TUBING) ×2
TUBE TRACH 6 EXL PROX UNCUF (TUBING) ×1 IMPLANT
WATER STERILE IRR 1000ML POUR (IV SOLUTION) ×3 IMPLANT

## 2016-09-14 NOTE — Transfer of Care (Signed)
Immediate Anesthesia Transfer of Care Note  Patient: Theodore Knox  Procedure(s) Performed: Procedure(s): TRACHEAL DILITATION (N/A)  Patient Location: PACU  Anesthesia Type:MAC  Level of Consciousness: awake, alert , oriented and patient cooperative  Airway & Oxygen Therapy: Patient Spontanous Breathing  With face mask on blow by Post-op Assessment: Report given to RN and Post -op Vital signs reviewed and stable  Post vital signs: Reviewed and stable  Last Vitals:  Vitals:   09/14/16 1203 09/14/16 1415  BP: (!) 133/93 (!) (P) 128/94  Pulse: 60   Resp: 20   Temp: 36.8 C (P) 36.5 C    Last Pain:  Vitals:   09/14/16 1203  TempSrc: Oral      Patients Stated Pain Goal: 2 (45/85/92 9244)  Complications: No apparent anesthesia complications

## 2016-09-14 NOTE — OR Nursing (Signed)
Obturator to PACU with patient.

## 2016-09-14 NOTE — Op Note (Signed)
OPERATIVE REPORT  DATE OF SURGERY: 09/14/2016  PATIENT:  Theodore Knox,  46 y.o. male  PRE-OPERATIVE DIAGNOSIS:  LARNGEAL STENOSIS  POST-OPERATIVE DIAGNOSIS:  * No post-op diagnosis entered *  PROCEDURE:  Procedure(s): TRACHEAL DILITATION  SURGEON:  Susy FrizzleJefry H Chaitanya Amedee, MD  ASSISTANTS: None  ANESTHESIA:   Intravenous sedation  EBL:  0 ml  DRAINS: None  LOCAL MEDICATIONS USED:  None  SPECIMEN:  none  COUNTS:  Correct  PROCEDURE DETAILS: The patient was taken to the operating room and placed on the operating table in the supine position. Following induction of intravenous propofol the patient anesthesia, the neck was draped in a standard fashion. The 4 Shiley uncuffed trach tube was removed. A #6 Shiley XLT was then inserted without difficulty. There is no need for any dilation or any other treatment. Velcro straps were used to secure this in place. Patient was transferred to PACU in stable condition.    PATIENT DISPOSITION:  To PACU, stable
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
## 2016-09-14 NOTE — Progress Notes (Signed)
Report given to maryann sahver rn as caregiver 

## 2016-09-14 NOTE — Anesthesia Preprocedure Evaluation (Signed)
Anesthesia Evaluation  Patient identified by MRN, date of birth, ID band Patient awake    Reviewed: Allergy & Precautions, NPO status , Patient's Chart, lab work & pertinent test results  Airway Mallampati: Trach       Dental   Pulmonary    Pulmonary exam normal        Cardiovascular hypertension, Pt. on medications Normal cardiovascular exam     Neuro/Psych Anxiety    GI/Hepatic   Endo/Other    Renal/GU      Musculoskeletal   Abdominal   Peds  Hematology   Anesthesia Other Findings   Reproductive/Obstetrics                             Anesthesia Physical Anesthesia Plan  ASA: III  Anesthesia Plan: General   Post-op Pain Management:    Induction: Intravenous  Airway Management Planned: Tracheostomy  Additional Equipment:   Intra-op Plan:   Post-operative Plan:   Informed Consent: I have reviewed the patients History and Physical, chart, labs and discussed the procedure including the risks, benefits and alternatives for the proposed anesthesia with the patient or authorized representative who has indicated his/her understanding and acceptance.     Plan Discussed with: CRNA and Surgeon  Anesthesia Plan Comments:         Anesthesia Quick Evaluation

## 2016-09-14 NOTE — Discharge Instructions (Signed)
Resume normal activity

## 2016-09-15 ENCOUNTER — Encounter (HOSPITAL_COMMUNITY): Payer: Self-pay | Admitting: Otolaryngology

## 2016-09-15 NOTE — Anesthesia Postprocedure Evaluation (Addendum)
Anesthesia Post Note  Patient: Theodore Knox  Procedure(s) Performed: Procedure(s) (LRB): TRACHEAL DILITATION; UPSIZE TRACH (N/A)  Patient location during evaluation: PACU Anesthesia Type: General Level of consciousness: awake and alert Pain management: pain level controlled Vital Signs Assessment: post-procedure vital signs reviewed and stable Respiratory status: spontaneous breathing, nonlabored ventilation, respiratory function stable and patient connected to nasal cannula oxygen Cardiovascular status: blood pressure returned to baseline and stable Postop Assessment: no signs of nausea or vomiting Anesthetic complications: no       Last Vitals:  Vitals:   09/14/16 1505 09/14/16 1515  BP:  (!) 125/91  Pulse: 69 70  Resp: 16 16  Temp: 36.3 C     Last Pain:  Vitals:   09/14/16 1515  TempSrc:   PainSc: 0-No pain                 Tess Potts DAVID

## 2016-10-24 ENCOUNTER — Emergency Department (HOSPITAL_COMMUNITY): Payer: Medicare Other

## 2016-10-24 ENCOUNTER — Emergency Department (HOSPITAL_COMMUNITY)
Admission: EM | Admit: 2016-10-24 | Discharge: 2016-10-25 | Disposition: A | Discharge status: Home / Self Care | Payer: Medicare Other | Attending: Emergency Medicine | Admitting: Emergency Medicine

## 2016-10-24 DIAGNOSIS — R05 Cough: Secondary | ICD-10-CM | POA: Diagnosis not present

## 2016-10-24 DIAGNOSIS — J9500 Unspecified tracheostomy complication: Secondary | ICD-10-CM | POA: Insufficient documentation

## 2016-10-24 DIAGNOSIS — I10 Essential (primary) hypertension: Secondary | ICD-10-CM | POA: Diagnosis not present

## 2016-10-24 DIAGNOSIS — R0602 Shortness of breath: Secondary | ICD-10-CM | POA: Diagnosis not present

## 2016-10-24 NOTE — ED Notes (Signed)
Pt states trach placed December of 2017 and this is the second time he has been trachea.

## 2016-10-24 NOTE — ED Triage Notes (Addendum)
Pt brought from [facility] for a dislodged tracheotomy after coughing jagg, pt unable to replace and also felt SOB saw blood in the inner cannula and requesting evaluation.. VS= BP=123/85 P= 94 Resp = 18 Sat= 97% RA

## 2016-10-24 NOTE — ED Notes (Signed)
Bed: [id number] Expected date:  Expected time:  Means of arrival:  Comments: Trach dislodged

## 2016-10-25 DIAGNOSIS — J9500 Unspecified tracheostomy complication: Secondary | ICD-10-CM | POA: Diagnosis not present

## 2016-10-25 NOTE — ED Provider Notes (Addendum)
[facility]-EMERGENCY DEPT Provider Note   CSN: [id number] Arrival date & time: 10/24/16  2215  By signing my name below, I, [name], attest that this documentation has been prepared under the direction and in the presence of Dione BoozeGlick, Antwuan Eckley, MD. Electronically Signed: [name], ED Scribe. 10/25/16. 1:12 AM.  History   Chief Complaint Chief Complaint  Patient presents with  . Tracheostomy Tube Change   The history is provided by the patient. No language interpreter was used.   HPI Comments: Theodore Knox is a 46 y.o. male with a PSHx of tracheal dilation, who presents to the Emergency Department via assisted living requesting a tracheostomy tube change after his became dislodged a few hours prior to arrival. Pt states he was furiously coughing and the tracheostomy came out. Per pt, he put it back in hiself and noticed some blood in the tube. Pt reports associated SOB after the incident. Pt denies any current discomfort or symptoms. No other complaints at this time.  Past Medical History:  Diagnosis Date  . Anxiety   . Dysphagia   . Hyperlipemia   . Hypertension   . Muscle weakness   . Stridor   . Subglottic stenosis   . TBI (traumatic brain injury) [facility](HCC)    age [age]    There are no active problems to display for this patient.   Past Surgical History:  Procedure Laterality Date  . TRACHEAL DILITATION N/A 09/14/2016   Procedure: TRACHEAL DILITATION; UPSIZE TRACH;  Surgeon: Serena Colonelosen, Jefry, MD;  [facility];  Service: ENT;  Laterality: N/A;  . TRACHEOSTOMY         Home Medications    Prior to Admission medications   Medication Sig Start Date End Date Taking? Authorizing Provider  ARIPiprazole (ABILIFY) 15 MG tablet Take 15 mg by mouth daily.   Yes [provider]  atorvastatin (LIPITOR) 20 MG tablet Take 20 mg by mouth every evening.   Yes [provider]  clomiPRAMINE (ANAFRANIL) 50 MG capsule Take 100 mg by mouth daily.   Yes [provider]  lithium carbonate 300 MG capsule Take 300 mg by mouth every morning.   Yes [provider]  Melatonin 3 MG TABS Take 6 mg by mouth at bedtime.   Yes [provider]  metoprolol tartrate (LOPRESSOR) 50 MG tablet Take 75 mg by mouth daily.   Yes [provider]  nystatin cream (MYCOSTATIN) Apply 1 application topically See admin instructions. TO BE APPLIED TO THE AFFECTED AREA(S) DAILY   Yes [provider]  OLANZapine (ZYPREXA) 5 MG tablet Take 5 mg by mouth 2 (two) times daily.   Yes [provider]  Omega-3 Fatty Acids (FISH OIL) 1000 MG CAPS Take 1,000 mg by mouth daily.   Yes [provider]  oxybutynin (DITROPAN) 5 MG tablet Take 5 mg by mouth daily.   Yes [provider]  acetaminophen (TYLENOL) 500 MG tablet Take 1,000 mg by mouth 2 (two) times daily as needed (for pain, headaches, or fever).    [provider]  diphenhydrAMINE (BENADRYL) 25 mg capsule Take 25 mg by mouth at bedtime as needed for sleep.    [provider]  ibuprofen (ADVIL,MOTRIN) 600 MG tablet Take 1 tablet (600 mg total) by mouth every 8 (eight) hours as needed. Patient taking differently: Take 600 mg by mouth every 8 (eight) hours as needed for moderate pain.  08/24/16   Azalia Bilisampos, [name], MD  methocarbamol (ROBAXIN) 500 MG tablet Take 1 tablet (  500 mg total) by mouth every 8 (eight) hours as needed for muscle spasms. 08/24/16   [name], MD    Family History No family history on file.  Social History Social History  Substance Use Topics  . Smoking status: Never Smoker  . Smokeless tobacco: Never Used  . Alcohol use No     Allergies   Patient has no known allergies.   Review of Systems Review of Systems  Constitutional: Negative for fever.  Respiratory: Positive for cough and shortness of breath.   All other systems reviewed and are negative.    Physical Exam Updated Vital Signs BP 119/87 (BP Location:  Left Arm)   Pulse 94   Temp 98.6 F (37 C) (Oral)   Resp 19   SpO2 94%   Physical Exam  Constitutional: He is oriented to person, place, and time. He appears well-developed and well-nourished.  HENT:  Head: Normocephalic and atraumatic.  Eyes: EOM are normal. Pupils are equal, round, and reactive to light.  Neck: Normal range of motion. Neck supple. No JVD present.  Tracheostomy in place.   Cardiovascular: Normal rate, regular rhythm and normal heart sounds.   No murmur heard. Pulmonary/Chest: Effort normal and breath sounds normal. He has no wheezes. He has no rales. He exhibits no tenderness.  Abdominal: Soft. Bowel sounds are normal. He exhibits no distension and no mass. There is no tenderness.  Musculoskeletal: Normal range of motion. He exhibits no edema.  Lymphadenopathy:    He has no cervical adenopathy.  Neurological: He is alert and oriented to person, place, and time. No cranial nerve deficit. He exhibits normal muscle tone. Coordination normal.  Skin: Skin is warm and dry. No rash noted.  Psychiatric: He has a normal mood and affect. His behavior is normal. Judgment and thought content normal.  Nursing note and vitals reviewed.    ED Treatments / Results  DIAGNOSTIC STUDIES: Oxygen Saturation is 94% on RA, adequate by my interpretation.   COORDINATION OF CARE: 1:08 AM-Discussed next steps with pt. Pt verbalized understanding and is agreeable with the plan.   Radiology Dg Chest 2 View  Result Date: 10/24/2016 CLINICAL DATA:  Acute onset of cough and congestion. Initial encounter. EXAM: CHEST  2 VIEW COMPARISON:  Chest radiograph performed 08/13/2016 FINDINGS: The lungs are well-aerated and clear. There is no evidence of focal opacification, pleural effusion or pneumothorax. The heart is normal in size; the mediastinal contour is within normal limits. No acute osseous abnormalities are seen. IMPRESSION: No acute cardiopulmonary process seen. Electronically Signed    By: Roanna Raider M.D.   On: 10/24/2016 23:51    Procedures Procedures (including critical care time)  Medications Ordered in ED Medications - No data to display   Initial Impression / Assessment and Plan / ED Course  I have reviewed the triage vital signs and the nursing notes.  Pertinent imaging results that were available during my care of the patient were reviewed by me and considered in my medical decision making (see chart for details).  Questionable aspiration following accidental dislodgment of tracheostomy tube. Patient is resting comfortably, showing no signs of distress. Chest x-ray shows no evidence of pneumonia. He has no further coughing. Respiratory therapy has been in to see him to get a new inner cannula for his tracheostomy. Old records are reviewed, and he is approximately 6 weeks post surgery to increase his stoma size. He is discharged to return to his PCP and ENT specialist as needed.  Final Clinical  Impressions(s) / ED Diagnoses   Final diagnoses:  Complication of tracheostomy tube [facility])    New Prescriptions New Prescriptions   No medications on file   I personally performed the services described in this documentation, which was scribed in my presence. The recorded information has been reviewed and is accurate.       Dione Booze, MD 10/25/16 Evelina Bucy    Dione Booze, MD 10/25/16 [phone]

## 2016-10-28 ENCOUNTER — Emergency Department (HOSPITAL_COMMUNITY)
Admission: EM | Admit: 2016-10-28 | Discharge: 2016-10-28 | Disposition: A | Discharge status: Home / Self Care | Payer: Medicare Other | Attending: Emergency Medicine | Admitting: Emergency Medicine

## 2016-10-28 ENCOUNTER — Encounter (HOSPITAL_COMMUNITY): Payer: Self-pay

## 2016-10-28 DIAGNOSIS — Z79899 Other long term (current) drug therapy: Secondary | ICD-10-CM | POA: Diagnosis not present

## 2016-10-28 DIAGNOSIS — Z430 Encounter for attention to tracheostomy: Secondary | ICD-10-CM | POA: Diagnosis not present

## 2016-10-28 DIAGNOSIS — I10 Essential (primary) hypertension: Secondary | ICD-10-CM | POA: Insufficient documentation

## 2016-10-28 MED ORDER — OLANZAPINE 5 MG PO TABS
5.0000 mg | ORAL_TABLET | Freq: Two times a day (BID) | ORAL | Status: DC
  Administered 2016-10-28: 5 mg via ORAL
  Filled 2016-10-28: qty 1

## 2016-10-28 MED ORDER — LITHIUM CARBONATE 300 MG PO CAPS: 300.0000 mg | ORAL_CAPSULE | Freq: Every morning | ORAL | Status: DC

## 2016-10-28 MED ORDER — LITHIUM CARBONATE 300 MG PO CAPS
300.0000 mg | ORAL_CAPSULE | Freq: Every morning | ORAL | Status: DC
  Administered 2016-10-28: 300 mg via ORAL
  Filled 2016-10-28: qty 1

## 2016-10-28 MED ORDER — OLANZAPINE 5 MG PO TABS: 5.0000 mg | ORAL_TABLET | Freq: Two times a day (BID) | ORAL | Status: DC

## 2016-10-28 NOTE — ED Notes (Signed)
Bed: WTR6 Expected date:  Expected time:  Means of arrival:  Comments: 

## 2016-10-28 NOTE — ED Provider Notes (Signed)
[facility] DEPT Provider Note   CSN: [id number] Arrival date & time: 10/28/16  0800     History   Chief Complaint Chief Complaint  Patient presents with  . Tracheostomy Tube Change    HPI Theodore Knox is a 46 y.o. male.  HPI  Patient presents to the emergency room for a new tracheostomy tube. The patient has history of traumatic brain injury. He has a tracheostomy.  Patient is scheduled to have a tracheostomy tube changed on July 3. He was told by his doctor to bring the tube in to the office at the time of that visit. Patient is not having any difficulty right now. He does not have the correct sized tube at home. He came into the emergency room so he can get the appropriate to bring to his appointment on July 3.  Patient also requested a dose of his morning medications while he is here.  Patient is not having any difficulty breathing. No fevers or chills. Past Medical History:  Diagnosis Date  . Anxiety   . Dysphagia   . Hyperlipemia   . Hypertension   . Muscle weakness   . Stridor   . Subglottic stenosis   . TBI (traumatic brain injury) [facility])    age 4    There are no active problems to display for this patient.   Past Surgical History:  Procedure Laterality Date  . TRACHEAL DILITATION N/A 09/14/2016   Procedure: TRACHEAL DILITATION; UPSIZE TRACH;  Surgeon: Serena Colonel, MD;  Location: [facility] OR;  Service: ENT;  Laterality: N/A;  . TRACHEOSTOMY         Home Medications    Prior to Admission medications   Medication Sig Start Date End Date Taking? Authorizing Provider  acetaminophen (TYLENOL) 500 MG tablet Take 1,000 mg by mouth 2 (two) times daily as needed (for pain, headaches, or fever).    [provider]  ARIPiprazole (ABILIFY) 15 MG tablet Take 15 mg by mouth daily.    [provider]  atorvastatin (LIPITOR) 20 MG tablet Take 20 mg by mouth every evening.    [provider]  clomiPRAMINE (ANAFRANIL) 50 MG capsule Take 100 mg by  mouth daily.    [provider]  diphenhydrAMINE (BENADRYL) 25 mg capsule Take 25 mg by mouth at bedtime as needed for sleep.    [provider]  ibuprofen (ADVIL,MOTRIN) 600 MG tablet Take 1 tablet (600 mg total) by mouth every 8 (eight) hours as needed. Patient taking differently: Take 600 mg by mouth every 8 (eight) hours as needed for moderate pain.  08/24/16   [name], MD  lithium carbonate 300 MG capsule Take 300 mg by mouth every morning.    [provider]  Melatonin 3 MG TABS Take 6 mg by mouth at bedtime.    [provider]  methocarbamol (ROBAXIN) 500 MG tablet Take 1 tablet (500 mg total) by mouth every 8 (eight) hours as needed for muscle spasms. 08/24/16   [name], MD  metoprolol tartrate (LOPRESSOR) 50 MG tablet Take 75 mg by mouth daily.    [provider]  nystatin cream (MYCOSTATIN) Apply 1 application topically See admin instructions. TO BE APPLIED TO THE AFFECTED AREA(S) DAILY    [provider]  OLANZapine (ZYPREXA) 5 MG tablet Take 5 mg by mouth 2 (two) times daily.    [provider]  Omega-3 Fatty Acids (FISH OIL) 1000 MG CAPS Take 1,000 mg by mouth daily.    [provider]  oxybutynin (DITROPAN) 5 MG tablet Take 5 mg by mouth daily.    [provider]    Family History History reviewed. No pertinent family history.  Social History Social History  Substance Use Topics  . Smoking status: Never Smoker  . Smokeless tobacco: Never Used  . Alcohol use No     Allergies   Patient has no known allergies.   Review of Systems Review of Systems  All other systems reviewed and are negative.    Physical Exam Updated Vital Signs BP (!) 149/105 (BP Location: Left Arm)   Pulse 86   Temp 98.6 F (37 C) (Oral)   Resp 16   SpO2 95%   Physical Exam  Constitutional: He appears well-developed and well-nourished. No distress.  HENT:  Head: Normocephalic and atraumatic.  Right  Ear: External ear normal.  Left Ear: External ear normal.  Tracheostomy in place, patient is breathing and speaking easily  Eyes: Conjunctivae are normal. Right eye exhibits no discharge. Left eye exhibits no discharge. No scleral icterus.  Neck: Neck supple. No tracheal deviation present.  Cardiovascular: Normal rate.   Pulmonary/Chest: Effort normal. No stridor. No respiratory distress.  Abdominal: He exhibits no distension.  Musculoskeletal: He exhibits no edema.  Neurological: He is alert. Cranial nerve deficit: no gross deficits.  Skin: Skin is warm and dry. No rash noted.  Psychiatric: He has a normal mood and affect.  Nursing note and vitals reviewed.    ED Treatments / Results     Procedures Procedures (including critical care time)  Medications Ordered in ED Medications  lithium carbonate capsule 300 mg (not administered)  OLANZapine (ZYPREXA) tablet 5 mg (not administered)     Initial Impression / Assessment and Plan / ED Course  I have reviewed the triage vital signs and the nursing notes.  Pertinent labs & imaging results that were available during my care of the patient were reviewed by me and considered in my medical decision making (see chart for details).   the patient has a specific sized tube that he needs. We do not have that stopped here in the emergency room.  Respiratory therapy evaluated the patient as well. It may be available at central supply however she confirmed that we do not have it here in the emergency department. The patient is not having any difficulties. He came to the emergency room because he needs this tooth before the appointment on July 3. I explained to him the best thing to do would be to call his doctor's office on Monday. Left them know that he does not have the appropriate size tube and could they possibly call in the appropriate size tube for him or possibly have it available at tracheostomy clinic  At the [facility] facility when he arrives  there.   Patient also requested dose of his morning medications because he is not at his assisted living facility right now and that is where he usually gets it.  Patient is not having any acute medical issues. He is stable for discharge.  Final Clinical Impressions(s) / ED Diagnoses   Final diagnoses:  Tracheostomy care [facility](HCC)    New Prescriptions New Prescriptions   No medications on file     Linwood DibblesKnapp, Krystyl Cannell, MD 10/28/16 [phone]

## 2016-10-28 NOTE — Discharge Instructions (Signed)
Call your ENT doctor on Monday and let them know that you do not have the correct size for the appointment on July 3rd, they should be able to call a new one in for you or have it available at the office when you go to your appointment

## 2016-10-28 NOTE — ED Triage Notes (Signed)
Per EMS, pt from [facility].  Pt states he was given refill trach's.  Pt states it is the wrong size and wants it changed.  Pt also out of medications.  No other complaints.  Vitals:  150/90, hr 98, resp 16, 95% ra

## 2016-11-22 ENCOUNTER — Encounter (HOSPITAL_COMMUNITY): Payer: Self-pay

## 2016-11-22 ENCOUNTER — Emergency Department (HOSPITAL_COMMUNITY): Payer: Medicare Other

## 2016-11-22 ENCOUNTER — Emergency Department (HOSPITAL_COMMUNITY)
Admission: EM | Admit: 2016-11-22 | Discharge: 2016-11-22 | Disposition: A | Discharge status: Home / Self Care | Payer: Medicare Other | Attending: Emergency Medicine | Admitting: Emergency Medicine

## 2016-11-22 DIAGNOSIS — I10 Essential (primary) hypertension: Secondary | ICD-10-CM | POA: Diagnosis not present

## 2016-11-22 DIAGNOSIS — R05 Cough: Secondary | ICD-10-CM | POA: Diagnosis present

## 2016-11-22 DIAGNOSIS — E785 Hyperlipidemia, unspecified: Secondary | ICD-10-CM | POA: Insufficient documentation

## 2016-11-22 DIAGNOSIS — Z430 Encounter for attention to tracheostomy: Secondary | ICD-10-CM | POA: Insufficient documentation

## 2016-11-22 DIAGNOSIS — J398 Other specified diseases of upper respiratory tract: Secondary | ICD-10-CM

## 2016-11-22 DIAGNOSIS — Z79899 Other long term (current) drug therapy: Secondary | ICD-10-CM | POA: Diagnosis not present

## 2016-11-22 MED ORDER — OLANZAPINE 5 MG PO TABS
5.0000 mg | ORAL_TABLET | Freq: Every day | ORAL | Status: DC
  Administered 2016-11-22: 5 mg via ORAL
  Filled 2016-11-22: qty 1

## 2016-11-22 MED ORDER — LEVOFLOXACIN 750 MG PO TABS: 750.0000 mg | ORAL_TABLET | Freq: Every day | ORAL | 0 refills | Status: AC

## 2016-11-22 MED ORDER — IPRATROPIUM-ALBUTEROL 0.5-2.5 (3) MG/3ML IN SOLN: 3.0000 mL | RESPIRATORY_TRACT | 0 refills | Status: DC | PRN

## 2016-11-22 MED ORDER — IPRATROPIUM-ALBUTEROL 0.5-2.5 (3) MG/3ML IN SOLN
3.0000 mL | Freq: Once | RESPIRATORY_TRACT | Status: AC
  Administered 2016-11-22: 3 mL via RESPIRATORY_TRACT
  Filled 2016-11-22: qty 3

## 2016-11-22 MED ORDER — LEVOFLOXACIN 750 MG PO TABS
750.0000 mg | ORAL_TABLET | Freq: Once | ORAL | Status: AC
  Administered 2016-11-22: 750 mg via ORAL
  Filled 2016-11-22: qty 1

## 2016-11-22 NOTE — ED Provider Notes (Signed)
[facility]-EMERGENCY DEPT Provider Note   CSN: [id number] Arrival date & time: 11/22/16  1737     History   Chief Complaint Chief Complaint  Patient presents with  . Tracheostomy Tube Change    HPI Theodore Knox is a 46 y.o. male.  HPI   46 year old male with past medical history of subglottic stenosis here with increasing cough. The patient states that over the last week, he has had increased tracheal secretions. He is having coughing spells and feels like he cannot get his secretions out. This has been an ongoing issue for him since his trach placement, but it is mildly worsened usual. Denies any fevers. His secretions do appear mildly more thicker than usual. Denies any shortness of breath when not coughing. Of note, he has known severe subglottic stenosis and is scheduled for a trach replacement in 2 weeks. 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  Past Medical History:  Diagnosis Date  . Anxiety   . Dysphagia   . Hyperlipemia   . Hypertension   . Muscle weakness   . Stridor   . Subglottic stenosis   . TBI (traumatic brain injury) [facility])    age 10    There are no active problems to display for this patient.   Past Surgical History:  Procedure Laterality Date  . TRACHEAL DILITATION N/A 09/14/2016   Procedure: TRACHEAL DILITATION; UPSIZE TRACH;  Surgeon: Serena Colonel, MD;  Location: [facility] OR;  Service: ENT;  Laterality: N/A;  . TRACHEOSTOMY         Home Medications    Prior to Admission medications   Medication Sig Start Date End Date Taking? Authorizing Provider  acetaminophen (TYLENOL) 500 MG tablet Take 1,000 mg by mouth 2 (two) times daily as needed (for pain, headaches, or fever).   Yes [provider]  ARIPiprazole (ABILIFY) 15 MG tablet Take 15 mg by mouth daily.   Yes [provider]  atorvastatin (LIPITOR) 20 MG tablet Take 20 mg by mouth every evening.   Yes [provider]  clomiPRAMINE (ANAFRANIL) 50 MG capsule  Take 100 mg by mouth daily.   Yes [provider]  diphenhydrAMINE (BENADRYL) 25 mg capsule Take 25 mg by mouth at bedtime as needed for sleep.   Yes [provider]  ibuprofen (ADVIL,MOTRIN) 600 MG tablet Take 1 tablet (600 mg total) by mouth every 8 (eight) hours as needed. Patient taking differently: Take 600 mg by mouth every 8 (eight) hours as needed for moderate pain.  08/24/16  Yes [name], MD  lithium carbonate 300 MG capsule Take 300 mg by mouth every morning.   Yes [provider]  Melatonin 3 MG TABS Take 6 mg by mouth at bedtime.   Yes [provider]  methocarbamol (ROBAXIN) 500 MG tablet Take 1 tablet (500 mg total) by mouth every 8 (eight) hours as needed for muscle spasms. 08/24/16  Yes [name], MD  metoprolol tartrate (LOPRESSOR) 50 MG tablet Take 75 mg by mouth daily.   Yes [provider]  nystatin cream (MYCOSTATIN) Apply 1 application topically See admin instructions. TO BE APPLIED TO THE AFFECTED AREA(S) DAILY   Yes [provider]  OLANZapine (ZYPREXA) 5 MG tablet Take 5 mg by mouth 2 (two) times daily.   Yes [provider]  Omega-3 Fatty Acids (FISH OIL) 1000 MG CAPS Take 1,000 mg by mouth daily.   Yes [provider]  oxybutynin (DITROPAN) 5 MG tablet Take 5 mg by  mouth daily.   Yes [provider]  ipratropium-albuterol (DUONEB) 0.5-2.5 (3) MG/3ML SOLN Take 3 mLs by nebulization every 4 (four) hours as needed (wheezing, cough). 11/22/16   Shaune Pollack, MD  levofloxacin (LEVAQUIN) 750 MG tablet Take 1 tablet (750 mg total) by mouth daily. 11/22/16 12/02/16  Shaune Pollack, MD    Family History History reviewed. No pertinent family history.  Social History Social History  Substance Use Topics  . Smoking status: Never Smoker  . Smokeless tobacco: Never Used  . Alcohol use No     Allergies   Patient has no known allergies.   Review of Systems Review of Systems    Constitutional: Negative for chills, fatigue and fever.  HENT: Negative for congestion and rhinorrhea.   Eyes: Negative for visual disturbance.  Respiratory: Positive for cough and shortness of breath. Negative for wheezing.   Cardiovascular: Negative for chest pain and leg swelling.  Gastrointestinal: Negative for abdominal pain, diarrhea, nausea and vomiting.  Genitourinary: Negative for dysuria and flank pain.  Musculoskeletal: Negative for neck pain and neck stiffness.  Skin: Negative for rash and wound.  Allergic/Immunologic: Negative for immunocompromised state.  Neurological: Negative for syncope, weakness and headaches.  All other systems reviewed and are negative.    Physical Exam Updated Vital Signs Pulse 88   Resp 16   SpO2 96%   Physical Exam  Constitutional: He is oriented to person, place, and time. He appears well-developed and well-nourished. No distress.  HENT:  Head: Normocephalic and atraumatic.  Eyes: Conjunctivae are normal.  Neck: Neck supple.  6-0 CFS shiley in place. Thick, inspissated secretions noted in inner canula. No surrounding skin breakdown, bleeding, or erythema.  Cardiovascular: Normal rate, regular rhythm and normal heart sounds.  Exam reveals no friction rub.   No murmur heard. Pulmonary/Chest: Effort normal. No respiratory distress. He has wheezes (scattered, mild). He has no rales.  Transmitted upper airway sounds, diffuse occasional rhonchi that clear with coughing  Abdominal: He exhibits no distension.  Musculoskeletal: He exhibits no edema.  Neurological: He is alert and oriented to person, place, and time. He exhibits normal muscle tone.  Skin: Skin is warm. Capillary refill takes less than 2 seconds. No rash noted.  Psychiatric: He has a normal mood and affect.  Nursing note and vitals reviewed.    ED Treatments / Results  Labs (all labs ordered are listed, but only abnormal results are displayed) Labs Reviewed - No data to  display  EKG  EKG Interpretation None       Radiology Dg Chest 2 View  Result Date: 11/22/2016 CLINICAL DATA:  Cough and congestion EXAM: CHEST  2 VIEW COMPARISON:  October 24, 2016 FINDINGS: Tracheostomy catheter tip is 2.4 cm above the carina. No pneumothorax. There is slight atelectatic change in the right middle lobe and left base regions. Lungs elsewhere are clear. Heart size and pulmonary vascularity are normal. No adenopathy. No evident bone lesions. IMPRESSION: Tracheostomy as described without pneumothorax. Areas of mild atelectatic change in the right middle lobe and left base. No edema or consolidation. Stable cardiac silhouette. Electronically Signed   By: Bretta Bang III M.D.   On: 11/22/2016 19:16    Procedures Procedures (including critical care time)  Medications Ordered in ED Medications  ipratropium-albuterol (DUONEB) 0.5-2.5 (3) MG/3ML nebulizer solution 3 mL (3 mLs Nebulization Given 11/22/16 1959)  levofloxacin (LEVAQUIN) tablet 750 mg (750 mg Oral Given 11/22/16 2129)     Initial Impression / Assessment and Plan / ED  Course  I have reviewed the triage vital signs and the nursing notes.  Pertinent labs & imaging results that were available during my care of the patient were reviewed by me and considered in my medical decision making (see chart for details).     46 yo M here with sensation that he cannot cough up sputum through his trach. On my assessment, pt is in NAD. Exam shows thick secretions in trach IC, which appears to not have been cleaned in some time. I replaced the inner canula with excellent improvement in his sx. Otherwise, he does report increased secretions so CXR obtained, which shows mild atelectasis but no significant PNA. No fever, hypoxia, or s/s to suggest sepsis or significant PNA. No erythema, pain at trach site, or sx to suggest significant tracheitis. I suspect pt's sx are 2/2 clogged inner cannula, now replaced, as well as possible  increased thickness of secretions from atelectasis/URI. Pt given breathing tx here as he does have occasional wheezing, with good effect. Will have him do breathing tx at his facility to help with mucus clearance, also give course of levaquin in the event of early PNA/bacterial overgrowth. Pt in agreement. Notified pt, mother, and facility of plan of care.  Final Clinical Impressions(s) / ED Diagnoses   Final diagnoses:  Tracheostomy care [facility](HCC)  Increased tracheal secretions    New Prescriptions Discharge Medication List as of 11/22/2016  8:45 PM    START taking these medications   Details  ipratropium-albuterol (DUONEB) 0.5-2.5 (3) MG/3ML SOLN Take 3 mLs by nebulization every 4 (four) hours as needed (wheezing, cough)., Starting Wed 11/22/2016, Print    levofloxacin (LEVAQUIN) 750 MG tablet Take 1 tablet (750 mg total) by mouth daily., Starting Wed 11/22/2016, Until Sat 12/02/2016, Print         Shaune PollackIsaacs, Takira Sherrin, MD 11/23/16 1225

## 2016-11-22 NOTE — ED Triage Notes (Signed)
[facility]- pt here from home with complaint of having to "force a cough." Pt ambulatory. No resp distress. Pt states it is time for trach tube change.

## 2016-11-22 NOTE — Discharge Instructions (Signed)
-   MAKE SURE YOU CONTINUE TO CLEAN YOUR TRACHEOSTOMY INNER CANULA (INNER TUBE) REGULARLY - USE THE ALBUTEROL NEBULIZER EVERY 4-6 HOURS TO HELP WITH SECRETIONS/COUGHING - IF YOU DEVELOP FEVER, WORSENING SHORTNESS OF BREATH, OR OTHER CONCERNING SYMPTOMS, RETURN TO THE ER IMMEDIATELY

## 2016-11-24 ENCOUNTER — Encounter (HOSPITAL_COMMUNITY): Payer: Self-pay | Admitting: Emergency Medicine

## 2016-11-24 ENCOUNTER — Emergency Department (HOSPITAL_COMMUNITY)
Admission: EM | Admit: 2016-11-24 | Discharge: 2016-11-24 | Disposition: A | Discharge status: Home / Self Care | Payer: Medicare Other | Attending: Emergency Medicine | Admitting: Emergency Medicine

## 2016-11-24 DIAGNOSIS — I10 Essential (primary) hypertension: Secondary | ICD-10-CM | POA: Diagnosis not present

## 2016-11-24 DIAGNOSIS — Z79899 Other long term (current) drug therapy: Secondary | ICD-10-CM | POA: Insufficient documentation

## 2016-11-24 DIAGNOSIS — Z430 Encounter for attention to tracheostomy: Secondary | ICD-10-CM | POA: Insufficient documentation

## 2016-11-24 DIAGNOSIS — R05 Cough: Secondary | ICD-10-CM | POA: Insufficient documentation

## 2016-11-24 DIAGNOSIS — R059 Cough, unspecified: Secondary | ICD-10-CM

## 2016-11-24 MED ORDER — ALBUTEROL SULFATE (2.5 MG/3ML) 0.083% IN NEBU
2.5000 mg | INHALATION_SOLUTION | Freq: Once | RESPIRATORY_TRACT | Status: AC
Start: 2016-11-24 — End: 2016-11-24
  Administered 2016-11-24: 2.5 mg via RESPIRATORY_TRACT
  Filled 2016-11-24: qty 3

## 2016-11-24 MED ORDER — ALBUTEROL SULFATE (2.5 MG/3ML) 0.083% IN NEBU: 2.5000 mg | INHALATION_SOLUTION | Freq: Two times a day (BID) | RESPIRATORY_TRACT | 0 refills | Status: DC

## 2016-11-24 NOTE — ED Notes (Signed)
[facility] was called for pt's transportation back to [facility] facility.

## 2016-11-24 NOTE — ED Triage Notes (Signed)
Brought in by EMS from [facility] for tracheostomy care--- pt reports that his trach needs suctioning.  No s/s gross secretions noted by EMS.  Per EMS, current staff at the facility "is incapable os suctioning a trach".  Pt arrived A/Ox4, in no s/s distress noted.

## 2016-11-24 NOTE — ED Provider Notes (Signed)
[facility]-EMERGENCY DEPT Provider Note   CSN: [id number] Arrival date & time: 11/24/16  2139  By signing my name below, I, Rosario Adie, attest that this documentation has been prepared under the direction and in the presence of Raeford Razor, MD. Electronically Signed: Rosario Adie, ED Scribe. 11/24/16. 9:57 PM.  History   Chief Complaint Chief Complaint  Patient presents with  . Tracheostomy Care   The history is provided by the patient. No language interpreter was used.    HPI Comments: Theodore Knox is a 46 y.o. male with a PMHx of TBI, subglottic stenosis, HLD/HTN, who presents to the Emergency Department complaining of persistent congestion to his tracheostomy tube beginning prior to arrival. Pt reports that he feels congested and requires suctioning to his tracheostomy and a nebulizing treatment for this issue. He additionally notes that the staff at his current facility is incapable of performing tracheostomy suctioning. He also reports that they have not been giving him nebulizing treatments at his facility as he needs. He has recently been seen in the ED for this issue on 07/18 (~2 days ago). At that time, he was also placed on Levofloxacin for possible early PNA at that time. He denies fever, or any other associated symptoms.   Past Medical History:  Diagnosis Date  . Anxiety   . Dysphagia   . Hyperlipemia   . Hypertension   . Muscle weakness   . Stridor   . Subglottic stenosis   . TBI (traumatic brain injury) [facility])    age 66   There are no active problems to display for this patient.  Past Surgical History:  Procedure Laterality Date  . TRACHEAL DILITATION N/A 09/14/2016   Procedure: TRACHEAL DILITATION; UPSIZE TRACH;  Surgeon: Serena Colonel, MD;  Location: [facility] OR;  Service: ENT;  Laterality: N/A;  . TRACHEOSTOMY      Home Medications    Prior to Admission medications   Medication Sig Start Date End Date Taking? Authorizing Provider  acetaminophen  (TYLENOL) 500 MG tablet Take 1,000 mg by mouth 2 (two) times daily as needed (for pain, headaches, or fever).    [provider]  ARIPiprazole (ABILIFY) 15 MG tablet Take 15 mg by mouth daily.    [provider]  atorvastatin (LIPITOR) 20 MG tablet Take 20 mg by mouth every evening.    [provider]  clomiPRAMINE (ANAFRANIL) 50 MG capsule Take 100 mg by mouth daily.    [provider]  diphenhydrAMINE (BENADRYL) 25 mg capsule Take 25 mg by mouth at bedtime as needed for sleep.    [provider]  ibuprofen (ADVIL,MOTRIN) 600 MG tablet Take 1 tablet (600 mg total) by mouth every 8 (eight) hours as needed. Patient taking differently: Take 600 mg by mouth every 8 (eight) hours as needed for moderate pain.  08/24/16   [name], MD  ipratropium-albuterol (DUONEB) 0.5-2.5 (3) MG/3ML SOLN Take 3 mLs by nebulization every 4 (four) hours as needed (wheezing, cough). 11/22/16   Shaune Pollack, MD  levofloxacin (LEVAQUIN) 750 MG tablet Take 1 tablet (750 mg total) by mouth daily. 11/22/16 12/02/16  Shaune Pollack, MD  lithium carbonate 300 MG capsule Take 300 mg by mouth every morning.    [provider]  Melatonin 3 MG TABS Take 6 mg by mouth at bedtime.    [provider]  methocarbamol (ROBAXIN) 500 MG tablet Take 1 tablet (500 mg total) by mouth every 8 (eight) hours as needed for muscle spasms. 08/24/16  [name], MD  metoprolol tartrate (LOPRESSOR) 50 MG tablet Take 75 mg by mouth daily.    [provider]  nystatin cream (MYCOSTATIN) Apply 1 application topically See admin instructions. TO BE APPLIED TO THE AFFECTED AREA(S) DAILY    [provider]  OLANZapine (ZYPREXA) 5 MG tablet Take 5 mg by mouth 2 (two) times daily.    [provider]  Omega-3 Fatty Acids (FISH OIL) 1000 MG CAPS Take 1,000 mg by mouth daily.    [provider]  oxybutynin (DITROPAN) 5 MG tablet Take 5 mg by mouth daily.     [provider]   Family History History reviewed. No pertinent family history.  Social History Social History  Substance Use Topics  . Smoking status: Never Smoker  . Smokeless tobacco: Never Used  . Alcohol use No   Allergies   Patient has no known allergies.  Review of Systems Review of Systems  Constitutional: Negative for fever.  HENT: Positive for congestion.   All other systems reviewed and are negative.  Physical Exam Updated Vital Signs BP (!) 143/97   Pulse 77   Temp 98.2 F (36.8 C) (Oral)   SpO2 96%   Physical Exam  Constitutional: He appears well-developed and well-nourished.  HENT:  Head: Normocephalic.  Right Ear: External ear normal.  Left Ear: External ear normal.  Nose: Nose normal.  Eyes: Conjunctivae are normal. Right eye exhibits no discharge. Left eye exhibits no discharge.  Neck:  Trach cap removed, inner cannula appears clear.   Cardiovascular: Normal rate, regular rhythm and normal heart sounds.   No murmur heard. Pulmonary/Chest: Effort normal. No respiratory distress. He has wheezes. He has no rales.  No apparent distress. Some faint wheezing on the left.   Abdominal: Soft. There is no tenderness. There is no rebound and no guarding.  Musculoskeletal: Normal range of motion. He exhibits no edema or tenderness.  Neurological: He is alert. No cranial nerve deficit. Coordination normal.  Skin: Skin is warm and dry. No rash noted. No erythema. No pallor.  Psychiatric: He has a normal mood and affect. His behavior is normal.  Nursing note and vitals reviewed.  ED Treatments / Results  DIAGNOSTIC STUDIES: Oxygen Saturation is 96% on RA, normal by my interpretation.   COORDINATION OF CARE: 9:57 PM-Discussed next steps with pt. Pt verbalized understanding and is agreeable with the plan.   Labs (all labs ordered are listed, but only abnormal results are displayed) Labs Reviewed - No data to display  EKG  EKG  Interpretation None      Radiology No results found.  Procedures Procedures   Medications Ordered in ED Medications - No data to display  Initial Impression / Assessment and Plan / ED Course  I have reviewed the triage vital signs and the nursing notes.  Pertinent labs & imaging results that were available during my care of the patient were reviewed by me and considered in my medical decision making (see chart for details).     45yM essentially presenting because he wants a nebulizer machine. Says he cant get one unless he has scheduled treatments as oppossed to PRN. I don't have a problem writing him for scheduled treatments for a few days if it would facilitate this.   Final Clinical Impressions(s) / ED Diagnoses   Final diagnoses:  Cough   New Prescriptions New Prescriptions   No medications on file   I personally preformed the services scribed in my presence. The recorded information  has been reviewed is accurate. Raeford RazorStephen Amaiya Scruton, MD.     [name], Kaislyn Gulas, MD 12/04/16 [phone]

## 2016-11-24 NOTE — ED Notes (Signed)
[facility] here to transport pt back to [facility] 

## 2016-11-24 NOTE — ED Notes (Signed)
Bed: [id number] Expected date:  Expected time:  Means of arrival:  Comments: EMS trach patient-needs suctioning

## 2016-12-01 ENCOUNTER — Emergency Department (HOSPITAL_COMMUNITY)
Admission: EM | Admit: 2016-12-01 | Discharge: 2016-12-02 | Disposition: A | Discharge status: Home / Self Care | Payer: Medicare Other | Attending: Emergency Medicine | Admitting: Emergency Medicine

## 2016-12-01 ENCOUNTER — Encounter (HOSPITAL_COMMUNITY): Payer: Self-pay | Admitting: Emergency Medicine

## 2016-12-01 DIAGNOSIS — J9500 Unspecified tracheostomy complication: Secondary | ICD-10-CM

## 2016-12-01 DIAGNOSIS — I10 Essential (primary) hypertension: Secondary | ICD-10-CM | POA: Diagnosis not present

## 2016-12-01 DIAGNOSIS — F419 Anxiety disorder, unspecified: Secondary | ICD-10-CM | POA: Diagnosis not present

## 2016-12-01 DIAGNOSIS — Z79899 Other long term (current) drug therapy: Secondary | ICD-10-CM | POA: Diagnosis not present

## 2016-12-01 DIAGNOSIS — J9509 Other tracheostomy complication: Secondary | ICD-10-CM | POA: Insufficient documentation

## 2016-12-01 NOTE — ED Triage Notes (Signed)
BIB EMS from [facility], pt called 911 b/c he was a social work consult and states he is unhappy with his living facility. Also reports he wants his trach changed. Inner cannula was just changed on 7/18.

## 2016-12-01 NOTE — ED Provider Notes (Signed)
[facility]-EMERGENCY DEPT Provider Note   CSN: [id number] Arrival date & time: 12/01/16  2146  By signing my name below, I, [name], attest that this documentation has been prepared under the direction and in the presence of [name], [name], *. Electronically Signed: Karren CobbleNy'Kea Lewis, ED Scribe. 12/02/16. 12:17 AM.  History   Chief Complaint Chief Complaint  Patient presents with  . Tracheostomy Tube Change   The history is provided by the patient. No language interpreter was used.   HPI HPI Comments: Theodore Knox is a 46 y.o. male with a PMHx of TBI, subglottic stenosis, HLD/HTN, brought in by ambulance, who presents to the Emergency Department for tracheostomy tube change. Pt reports he has been feeling air coming out the sides of his tube. Denies shortness of breath, fever, or abdominal pain.   Pt also requesting a change in his anxiety medication stating "he needs something stronger". He does not report any specific new changes in his life.  Past Medical History:  Diagnosis Date  . Anxiety   . Dysphagia   . Hyperlipemia   . Hypertension   . Muscle weakness   . Stridor   . Subglottic stenosis   . TBI (traumatic brain injury) [facility](HCC)    age 46   There are no active problems to display for this patient.  Past Surgical History:  Procedure Laterality Date  . TRACHEAL DILITATION N/A 09/14/2016   Procedure: TRACHEAL DILITATION; UPSIZE TRACH;  Surgeon: Serena Colonelosen, Jefry, MD;  [facility];  Service: ENT;  Laterality: N/A;  . TRACHEOSTOMY      Home Medications    Prior to Admission medications   Medication Sig Start Date End Date Taking? Authorizing Provider  acetaminophen (TYLENOL) 500 MG tablet Take 1,000 mg by mouth 2 (two) times daily as needed (for pain, headaches, or fever).    [provider]  albuterol (PROVENTIL) (2.5 MG/3ML) 0.083% nebulizer solution Take 3 mLs (2.5 mg total) by nebulization every 12 (twelve) hours. 11/24/16   [name], [name], MD    ARIPiprazole (ABILIFY) 15 MG tablet Take 15 mg by mouth daily.    [provider]  atorvastatin (LIPITOR) 20 MG tablet Take 20 mg by mouth every evening.    [provider]  clomiPRAMINE (ANAFRANIL) 50 MG capsule Take 100 mg by mouth daily.    [provider]  diphenhydrAMINE (BENADRYL) 25 mg capsule Take 25 mg by mouth at bedtime as needed for sleep.    [provider]  ibuprofen (ADVIL,MOTRIN) 600 MG tablet Take 1 tablet (600 mg total) by mouth every 8 (eight) hours as needed. Patient taking differently: Take 600 mg by mouth every 8 (eight) hours as needed for moderate pain.  08/24/16   Azalia Bilisampos, [name], MD  ipratropium-albuterol (DUONEB) 0.5-2.5 (3) MG/3ML SOLN Take 3 mLs by nebulization every 4 (four) hours as needed (wheezing, cough). 11/22/16   Shaune PollackIsaacs, [name], MD  levofloxacin (LEVAQUIN) 750 MG tablet Take 1 tablet (750 mg total) by mouth daily. 11/22/16 12/02/16  Shaune PollackIsaacs, [name], MD  lithium carbonate 300 MG capsule Take 300 mg by mouth every morning.    [provider]  Melatonin 3 MG TABS Take 6 mg by mouth at bedtime.    [provider]  methocarbamol (ROBAXIN) 500 MG tablet Take 1 tablet (500 mg total) by mouth every 8 (eight) hours as needed for muscle spasms. 08/24/16   Azalia Bilisampos, [name], MD  metoprolol tartrate (LOPRESSOR) 50 MG tablet Take 75 mg by mouth daily.    [provider]  nystatin cream (MYCOSTATIN) Apply 1 application topically See admin instructions. TO BE APPLIED TO THE AFFECTED AREA(S) DAILY    [provider]  OLANZapine (ZYPREXA) 5 MG tablet Take 5 mg by mouth 2 (two) times daily.    [provider]  Omega-3 Fatty Acids (FISH OIL) 1000 MG CAPS Take 1,000 mg by mouth daily.    [provider]  oxybutynin (DITROPAN) 5 MG tablet Take 5 mg by mouth daily.    [provider]    Family History No family history on file.  Social History Social History  Substance Use Topics  .  Smoking status: Never Smoker  . Smokeless tobacco: Never Used  . Alcohol use No   Allergies   Patient has no known allergies.  Review of Systems Review of Systems  Constitutional: Negative for fever.  Respiratory: Negative for shortness of breath.   Gastrointestinal: Negative for abdominal pain.   Physical Exam Updated Vital Signs BP 130/86   Pulse 75   Temp 98.7 F (37.1 C)   Resp 18   Ht 5\' 7"  (1.702 m)   Wt 90.7 kg (200 lb)   SpO2 94%   BMI 31.32 kg/m   Physical Exam  Constitutional: He is oriented to person, place, and time. He appears well-developed and well-nourished. No distress.  HENT:  Head: Normocephalic and atraumatic.  Right Ear: Hearing normal.  Left Ear: Hearing normal.  Nose: Nose normal.  Mouth/Throat: Oropharynx is clear and moist and mucous membranes are normal.  Eyes: Pupils are equal, round, and reactive to light. Conjunctivae and EOM are normal.  Neck: Normal range of motion. Neck supple.  Trache in place. No signs of swelling or drainage.   Cardiovascular: Regular rhythm, S1 normal and S2 normal.  Exam reveals no gallop and no friction rub.   No murmur heard. Pulmonary/Chest: Effort normal and breath sounds normal. No respiratory distress. He exhibits no tenderness.  Abdominal: Soft. Normal appearance and bowel sounds are normal. There is no hepatosplenomegaly. There is no tenderness. There is no rebound, no guarding, no tenderness at McBurney's point and negative Murphy's sign. No hernia.  Musculoskeletal: Normal range of motion.  Neurological: He is alert and oriented to person, place, and time. He has normal strength. No cranial nerve deficit or sensory deficit. Coordination normal. GCS eye subscore is 4. GCS verbal subscore is 5. GCS motor subscore is 6.  Skin: Skin is warm, dry and intact. No rash noted. No cyanosis.  Psychiatric: He has a normal mood and affect. His speech is normal and behavior is normal. Thought content normal.  Nursing note  and vitals reviewed.   ED Treatments / Results  DIAGNOSTIC STUDIES: Oxygen Saturation is 94% on RA, low by my interpretation.   COORDINATION OF CARE: 11:59 PM-Discussed next steps with pt. Pt verbalized understanding and is agreeable with the plan.   Labs (all labs ordered are listed, but only abnormal results are displayed) Labs Reviewed - No data to display  EKG  EKG Interpretation None       Radiology No results found.  Procedures Procedures (including critical care time)  Medications Ordered in ED Medications  ALPRAZolam Prudy Feeler(XANAX) tablet 0.5 mg (0.5 mg Oral Given 12/02/16 0042)    Initial Impression / Assessment and Plan / ED Course  I have reviewed the triage vital signs and the nursing notes.  Pertinent labs & imaging results that were available during my care of the patient were reviewed by me and considered in my  medical decision making (see chart for details).     Patient presents to the emergency department with multiple problems. Patient reports that he has an air leak in his tracheostomy. He is breathing comfortably. No stridor, no wheezing. Lungs are clear. Oxygenation is normal. Respiratory therapy has evaluated him. They have found from records that he has had previous history of very difficult trach changes. Patient therefore had an inner cannula change performed.  Patient reports that he has been experiencing anxiety. He is not homicidal or suicidal. He was given Xanax here in the ER and will be discharged with Vistaril to be used as needed for anxiety.  Expresses unhappiness with his living facility. There does not appear to be any safety issues. There is no social work available currently, will have a consult placed, patient counseled that he needs to work with his facility and case manager if he wants to change his living arrangements.  Final Clinical Impressions(s) / ED Diagnoses   Final diagnoses:  Complication of tracheostomy tube (HCC)  Anxiety      New Prescriptions New Prescriptions   No medications on file  I personally performed the services described in this documentation, which was scribed in my presence. The recorded information has been reviewed and is accurate.      Gilda Crease, MD 12/02/16 [phone]

## 2016-12-02 DIAGNOSIS — J9509 Other tracheostomy complication: Secondary | ICD-10-CM | POA: Diagnosis not present

## 2016-12-02 MED ORDER — HYDROXYZINE HCL 25 MG PO TABS: 25.0000 mg | ORAL_TABLET | Freq: Four times a day (QID) | ORAL | 0 refills | Status: DC | PRN

## 2016-12-02 MED ORDER — ALPRAZOLAM 0.25 MG PO TABS
0.5000 mg | ORAL_TABLET | Freq: Once | ORAL | Status: AC
  Administered 2016-12-02: 0.5 mg via ORAL
  Filled 2016-12-02: qty 2

## 2016-12-02 NOTE — ED Notes (Signed)
CALLED [facility] FOR TRANSPORT TO [facility]--Theodore Knox

## 2016-12-02 NOTE — Progress Notes (Signed)
Patient suctioned and #6 XLT shiley inner cannula placed. No problems noted.

## 2016-12-02 NOTE — ED Notes (Addendum)
Inner cannulas delivered, brought to respiratory room per [employer] operational officersecretary

## 2016-12-02 NOTE — ED Notes (Signed)
Respiratory filled out inter cannula request for materials management waiting on delivery

## 2016-12-02 NOTE — ED Notes (Signed)
Pt stable, ambulatory, states understanding of discharge instructions 

## 2016-12-02 NOTE — ED Notes (Signed)
Pt updated

## 2016-12-02 NOTE — ED Notes (Signed)
Respiratory states wrong size cannula ordered/delivered.  Correct size ordered from materials management.  This RN unaware cannula wasn't changed

## 2016-12-05 ENCOUNTER — Emergency Department (HOSPITAL_COMMUNITY)
Admission: EM | Admit: 2016-12-05 | Discharge: 2016-12-05 | Disposition: A | Discharge status: Home / Self Care | Payer: Medicare Other | Attending: Emergency Medicine | Admitting: Emergency Medicine

## 2016-12-05 ENCOUNTER — Encounter (HOSPITAL_COMMUNITY): Payer: Self-pay

## 2016-12-05 DIAGNOSIS — J9500 Unspecified tracheostomy complication: Secondary | ICD-10-CM | POA: Diagnosis present

## 2016-12-05 DIAGNOSIS — Z79899 Other long term (current) drug therapy: Secondary | ICD-10-CM | POA: Diagnosis not present

## 2016-12-05 DIAGNOSIS — I10 Essential (primary) hypertension: Secondary | ICD-10-CM | POA: Diagnosis not present

## 2016-12-05 MED ORDER — IPRATROPIUM-ALBUTEROL 0.5-2.5 (3) MG/3ML IN SOLN
3.0000 mL | Freq: Four times a day (QID) | RESPIRATORY_TRACT | Status: DC
  Filled 2016-12-05: qty 3

## 2016-12-05 MED ORDER — IPRATROPIUM-ALBUTEROL 0.5-2.5 (3) MG/3ML IN SOLN
3.0000 mL | Freq: Once | RESPIRATORY_TRACT | Status: AC
  Administered 2016-12-05: 3 mL via RESPIRATORY_TRACT

## 2016-12-05 MED ORDER — ALBUTEROL SULFATE (2.5 MG/3ML) 0.083% IN NEBU: 2.5000 mg | INHALATION_SOLUTION | Freq: Four times a day (QID) | RESPIRATORY_TRACT | 1 refills | Status: DC | PRN

## 2016-12-05 NOTE — ED Provider Notes (Signed)
[facility]-EMERGENCY DEPT Provider Note   CSN: [id number] Arrival date & time: 12/05/16  1946     History   Chief Complaint Chief Complaint  Patient presents with  . Trach check    HPI Theodore Knox is a 46 y.o. male with past medical history of TBI, subglottic stenosis, hypertension, hyperlipidemia who presents to the emergency department today for a tracheostomy check. The patient reportedly removed his trach tube in order to show nurse's so that he could come to the emergency department. He states that the inside of his trach tube was "yellow". Respiratory therapists has seen the patient, replaced the cannula and provided the patient with additional cannula's. Additionally the patient was suctioned and provided with a nebulizer treatment. The patient is without fever, chills, cough, chest pain, sob, drainage, or redness surrounding the tracheostomy tube.   HPI  Past Medical History:  Diagnosis Date  . Anxiety   . Dysphagia   . Hyperlipemia   . Hypertension   . Muscle weakness   . Stridor   . Subglottic stenosis   . TBI (traumatic brain injury) [facility])    age 45    There are no active problems to display for this patient.   Past Surgical History:  Procedure Laterality Date  . TRACHEAL DILITATION N/A 09/14/2016   Procedure: TRACHEAL DILITATION; UPSIZE TRACH;  Surgeon: Serena Colonel, MD;  Location: [facility] OR;  Service: ENT;  Laterality: N/A;  . TRACHEOSTOMY         Home Medications    Prior to Admission medications   Medication Sig Start Date End Date Taking? Authorizing Provider  acetaminophen (TYLENOL) 500 MG tablet Take 1,000 mg by mouth 2 (two) times daily as needed (for pain, headaches, or fever).    [provider]  albuterol (PROVENTIL) (2.5 MG/3ML) 0.083% nebulizer solution Take 3 mLs (2.5 mg total) by nebulization every 6 (six) hours as needed for wheezing or shortness of breath. 12/05/16   [name], Elmer Sow, PA-C  ARIPiprazole (ABILIFY) 15 MG tablet Take 15 mg  by mouth daily.    [provider]  atorvastatin (LIPITOR) 20 MG tablet Take 20 mg by mouth every evening.    [provider]  clomiPRAMINE (ANAFRANIL) 50 MG capsule Take 100 mg by mouth daily.    [provider]  diphenhydrAMINE (BENADRYL) 25 mg capsule Take 25 mg by mouth at bedtime as needed for sleep.    [provider]  hydrOXYzine (ATARAX/VISTARIL) 25 MG tablet Take 1 tablet (25 mg total) by mouth every 6 (six) hours as needed for anxiety. 12/02/16   Gilda Crease, MD  ibuprofen (ADVIL,MOTRIN) 600 MG tablet Take 1 tablet (600 mg total) by mouth every 8 (eight) hours as needed. Patient taking differently: Take 600 mg by mouth every 8 (eight) hours as needed for moderate pain.  08/24/16   [name], MD  ipratropium-albuterol (DUONEB) 0.5-2.5 (3) MG/3ML SOLN Take 3 mLs by nebulization every 4 (four) hours as needed (wheezing, cough). 11/22/16   Shaune Pollack, MD  lithium carbonate 300 MG capsule Take 300 mg by mouth every morning.    [provider]  Melatonin 3 MG TABS Take 6 mg by mouth at bedtime.    [provider]  methocarbamol (ROBAXIN) 500 MG tablet Take 1 tablet (500 mg total) by mouth every 8 (eight) hours as needed for muscle spasms. 08/24/16   [name], MD  metoprolol tartrate (LOPRESSOR) 50 MG tablet Take 75 mg by mouth daily.    [provider]  nystatin cream (MYCOSTATIN) Apply 1 application topically See admin instructions. TO BE APPLIED TO THE AFFECTED AREA(S) DAILY    [provider]  OLANZapine (ZYPREXA) 5 MG tablet Take 5 mg by mouth 2 (two) times daily.    [provider]  Omega-3 Fatty Acids (FISH OIL) 1000 MG CAPS Take 1,000 mg by mouth daily.    [provider]  oxybutynin (DITROPAN) 5 MG tablet Take 5 mg by mouth daily.    [provider]    Family History No family history on file.  Social History Social History  Substance Use Topics  . Smoking  status: Never Smoker  . Smokeless tobacco: Never Used  . Alcohol use No     Allergies   Patient has no known allergies.   Review of Systems Review of Systems  Constitutional: Negative for chills and fever.  HENT: Negative for sore throat.   Respiratory: Negative for shortness of breath.   Cardiovascular: Negative for chest pain.  Skin: Negative for color change and wound.     Physical Exam Updated Vital Signs BP (!) 144/85 (BP Location: Left Arm)   Pulse 88   Temp 98.4 F (36.9 C) (Oral)   Resp 19   SpO2 96%   Physical Exam  Constitutional: He appears well-developed and well-nourished. No distress.  Non-toxic appearing  HENT:  Head: Normocephalic and atraumatic.  Right Ear: External ear normal.  Left Ear: External ear normal.  Nose: Nose normal.  Mouth/Throat: Uvula is midline, oropharynx is clear and moist and mucous membranes are normal. No tonsillar exudate.  Eyes: Conjunctivae are normal. Right eye exhibits no discharge. Left eye exhibits no discharge. No scleral icterus.  Neck: Trachea normal, normal range of motion, full passive range of motion without pain and phonation normal. No muscular tenderness present. No neck rigidity. Normal range of motion present.    Cardiovascular: Normal rate, regular rhythm and normal heart sounds.   Pulmonary/Chest: Effort normal and breath sounds normal. No stridor. No respiratory distress. He has no wheezes.  Neurological: He is alert.  Skin: No pallor.  Psychiatric: He has a normal mood and affect.  Alert and orientated.   Nursing note and vitals reviewed.  ED Treatments / Results  Labs (all labs ordered are listed, but only abnormal results are displayed) Labs Reviewed - No data to display  EKG  EKG Interpretation None       Radiology No results found.  Procedures Procedures (including critical care time)  Medications Ordered in ED Medications  ipratropium-albuterol (DUONEB) 0.5-2.5 (3) MG/3ML nebulizer  solution 3 mL (3 mLs Nebulization Given 12/05/16 2031)     Initial Impression / Assessment and Plan / ED Course  I have reviewed the triage vital signs and the nursing notes.  Pertinent labs & imaging results that were available during my care of the patient were reviewed by me and considered in my medical decision making (see chart for details).      MDM Number of Diagnoses or Management Options Complication of tracheostomy tube [facility](HCC):   46 year old male presenting for tracheostomy check. Patient vital signs reassuring, and patient appears non-toxic on presentation. Respiratory therapy saw patient and changed cannula, provided patient with additional and provided breathing treatment. Patient is otherwise a symptomatic. There is no signs of infection around the tracheostomy tube. Patient is requesting nebulizer, I do not have problem writing for this. No other complaints at this time. The evaluation does not show pathology that would require ongoing  emergent intervention or inpatient treatment. I advised the patient to follow-up with PCP this week. I advised the patient to return to the emergency department with new or worsening symptoms or new concerns. Specific return precautions discussed including but not limited to signs of infectio. The patient verbalized understanding and agreement with plan. All questions answered. No further questions at this time. The patient is hemodynamically stable, mentating appropriately and appears safe for discharge.  Patient case discussed with Dr. Clayborne DanaMesner who is in agreement with plan.    Final Clinical Impressions(s) / ED Diagnoses   Final diagnoses:  Complication of tracheostomy tube (HCC)    New Prescriptions New Prescriptions   ALBUTEROL (PROVENTIL) (2.5 MG/3ML) 0.083% NEBULIZER SOLUTION    Take 3 mLs (2.5 mg total) by nebulization every 6 (six) hours as needed for wheezing or shortness of breath.     Princella PellegriniMaczis, Anilah Huck M, PA-C 12/05/16 2150      Marily MemosMesner, Jason, MD 12/06/16 [phone]

## 2016-12-05 NOTE — ED Notes (Signed)
RT at bedside to check patient's trach

## 2016-12-05 NOTE — ED Notes (Signed)
Bed: [id number] Expected date:  Expected time:  Means of arrival:  Comments: EMS 46 yo ambulatory took his trach out and put it back in

## 2016-12-05 NOTE — Discharge Instructions (Signed)
You were seen here today because you removed your tracheostomy tube.   The canula was replaced. Please follow up with PCP. If you do not have one please call the phone number on this handout to find one. If you develop fever, chills, productive cough, shortness of breath you may return for re-evaluation. If you have redness or discharge around your trach site you may return for re-evaluation. If you develop worsening or new concerning symptoms you can return to the emergency department for re-evaluation.

## 2016-12-05 NOTE — ED Triage Notes (Signed)
Patient arrives ambulatory from assisted living. Patient is here frequently for trach checks. Patient took his trach out to look at it and put it back in. Assisted living staff want patient's trach to be checked for placement.Respiratory called to check trach.

## 2016-12-16 ENCOUNTER — Emergency Department (HOSPITAL_COMMUNITY)
Admission: EM | Admit: 2016-12-16 | Discharge: 2016-12-16 | Disposition: A | Discharge status: Home / Self Care | Payer: Medicare Other | Attending: Emergency Medicine | Admitting: Emergency Medicine

## 2016-12-16 ENCOUNTER — Encounter (HOSPITAL_COMMUNITY): Payer: Self-pay | Admitting: Emergency Medicine

## 2016-12-16 DIAGNOSIS — Z79899 Other long term (current) drug therapy: Secondary | ICD-10-CM | POA: Insufficient documentation

## 2016-12-16 DIAGNOSIS — I10 Essential (primary) hypertension: Secondary | ICD-10-CM | POA: Insufficient documentation

## 2016-12-16 DIAGNOSIS — Z430 Encounter for attention to tracheostomy: Secondary | ICD-10-CM | POA: Insufficient documentation

## 2016-12-16 MED ORDER — ALBUTEROL SULFATE (2.5 MG/3ML) 0.083% IN NEBU
5.0000 mg | INHALATION_SOLUTION | Freq: Once | RESPIRATORY_TRACT | Status: AC
  Administered 2016-12-16: 5 mg via RESPIRATORY_TRACT
  Filled 2016-12-16: qty 6

## 2016-12-16 NOTE — ED Notes (Signed)
Bed: [facility] Expected date:  Expected time:  Means of arrival:  Comments: 

## 2016-12-16 NOTE — Discharge Instructions (Signed)
It is very important that you establish care with tracheostomy clinic. They are the ones will be able to change your tracheostomy in the future. They are open Monday through Friday from 8-5. You should call on Monday to schedule an appointment. Call one of the 2 numbers listed below. [phone] Return to the emergency room if you develop fever, chills, pus leaking from around the trach, dislodgment of the trach, or any new or worsening symptoms.

## 2016-12-16 NOTE — ED Triage Notes (Signed)
Respiratory therapy at bedside.

## 2016-12-16 NOTE — ED Provider Notes (Signed)
[facility]-EMERGENCY DEPT Provider Note   CSN: [id number] Arrival date & time: 12/16/16  1311  By signing my name below, I, Diona Browner, attest that this documentation has been prepared under the direction and in the presence of Aryianna Earwood, PA-C. Electronically Signed: Diona Browner, ED Scribe. 12/16/16. 2:03 PM.  History   Chief Complaint Chief Complaint  Patient presents with  . trach problem    HPI Theodore Knox is a 46 y.o. male from [facility] with a PMHx of dysphagia, HLD, HTN, TBI, tracheal dilitation, and tracheostomy, who presents to the Emergency Department with a chief complaint of needing his trach replaced. He states it has been several months since his last trach replacement, and he was told that he should get it changed every 3-4 months by the doctor who initially placed it. He states he does not have a doctor who can do this for him, so he comes to the emergency room to get it replaced. He is not complaining of any pain or pus surrounding the trach site. He denies fever, chills, vomiting, or dislodgment of the trach. He reports an intermittent cough which has been present since trach placement. He requests a nebulizer treatment, as he feels he can breathe better after that. Pt denies nausea, decreased appetite, abdominal pain, or any other complaints at this time.  Trach placement done in [place], and patient states he has never had trach follow-up care.  The history is provided by the patient. No language interpreter was used.    Past Medical History:  Diagnosis Date  . Anxiety   . Dysphagia   . Hyperlipemia   . Hypertension   . Muscle weakness   . Stridor   . Subglottic stenosis   . TBI (traumatic brain injury) [facility])    age 78    There are no active problems to display for this patient.   Past Surgical History:  Procedure Laterality Date  . TRACHEAL DILITATION N/A 09/14/2016   Procedure: TRACHEAL DILITATION; UPSIZE TRACH;  Surgeon: Serena Colonel, MD;  Location: [facility] OR;  Service: ENT;  Laterality: N/A;  . TRACHEOSTOMY         Home Medications    Prior to Admission medications   Medication Sig Start Date End Date Taking? Authorizing Provider  acetaminophen (TYLENOL) 500 MG tablet Take 1,000 mg by mouth 2 (two) times daily as needed (for pain, headaches, or fever).    [provider]  albuterol (PROVENTIL) (2.5 MG/3ML) 0.083% nebulizer solution Take 3 mLs (2.5 mg total) by nebulization every 6 (six) hours as needed for wheezing or shortness of breath. 12/05/16   Maczis, Elmer Sow, PA-C  ARIPiprazole (ABILIFY) 15 MG tablet Take 15 mg by mouth daily.    [provider]  atorvastatin (LIPITOR) 20 MG tablet Take 20 mg by mouth every evening.    [provider]  clomiPRAMINE (ANAFRANIL) 50 MG capsule Take 100 mg by mouth daily.    [provider]  diphenhydrAMINE (BENADRYL) 25 mg capsule Take 25 mg by mouth at bedtime as needed for sleep.    [provider]  hydrOXYzine (ATARAX/VISTARIL) 25 MG tablet Take 1 tablet (25 mg total) by mouth every 6 (six) hours as needed for anxiety. 12/02/16   Gilda Crease, MD  ibuprofen (ADVIL,MOTRIN) 600 MG tablet Take 1 tablet (600 mg total) by mouth every 8 (eight) hours as needed. Patient taking differently: Take 600 mg by mouth every 8 (eight) hours as needed for moderate pain.  08/24/16  [name], MD  ipratropium-albuterol (DUONEB) 0.5-2.5 (3) MG/3ML SOLN Take 3 mLs by nebulization every 4 (four) hours as needed (wheezing, cough). 11/22/16   Shaune Pollack, MD  lithium carbonate 300 MG capsule Take 300 mg by mouth every morning.    [provider]  Melatonin 3 MG TABS Take 6 mg by mouth at bedtime.    [provider]  methocarbamol (ROBAXIN) 500 MG tablet Take 1 tablet (500 mg total) by mouth every 8 (eight) hours as needed for muscle spasms. 08/24/16   [name], MD  metoprolol tartrate (LOPRESSOR) 50 MG tablet Take 75 mg  by mouth daily.    [provider]  nystatin cream (MYCOSTATIN) Apply 1 application topically See admin instructions. TO BE APPLIED TO THE AFFECTED AREA(S) DAILY    [provider]  OLANZapine (ZYPREXA) 5 MG tablet Take 5 mg by mouth 2 (two) times daily.    [provider]  Omega-3 Fatty Acids (FISH OIL) 1000 MG CAPS Take 1,000 mg by mouth daily.    [provider]  oxybutynin (DITROPAN) 5 MG tablet Take 5 mg by mouth daily.    [provider]    Family History No family history on file.  Social History Social History  Substance Use Topics  . Smoking status: Never Smoker  . Smokeless tobacco: Never Used  . Alcohol use No     Allergies   Patient has no known allergies.   Review of Systems Review of Systems  Constitutional: Negative for appetite change, chills and fever.  Respiratory: Positive for cough.   Gastrointestinal: Negative for abdominal pain, nausea and vomiting.     Physical Exam Updated Vital Signs BP (!) 128/92   Pulse 84   Temp 98.6 F (37 C) (Oral)   SpO2 100%   Physical Exam  Constitutional: He is oriented to person, place, and time. He appears well-developed and well-nourished. No distress.  HENT:  Head: Normocephalic and atraumatic.  Nose: Nose normal.  Mouth/Throat: Uvula is midline, oropharynx is clear and moist and mucous membranes are normal. No oropharyngeal exudate.  Eyes: EOM are normal.  Neck: Normal range of motion.  Trach in place without surrounding erythema or drainage. No signs of cellulitis at this time. Sputum from trach is clear and minimal.  Cardiovascular: Normal rate, regular rhythm and intact distal pulses.   Pulmonary/Chest: Effort normal and breath sounds normal. No respiratory distress. He has no wheezes. He has no rales.  Lung sounds clear in all fields. Good air movement.  Abdominal: He exhibits no distension.  Musculoskeletal: Normal range of motion.  Lymphadenopathy:    He has  no cervical adenopathy.  Neurological: He is alert and oriented to person, place, and time.  Skin: Skin is warm and dry.  Psychiatric: He has a normal mood and affect.  Nursing note and vitals reviewed.    ED Treatments / Results  DIAGNOSTIC STUDIES: Oxygen Saturation is 98% on RA, normal by my interpretation.   COORDINATION OF CARE: 2:03 PM-Discussed next steps with pt. Pt verbalized understanding and is agreeable with the plan.   Labs (all labs ordered are listed, but only abnormal results are displayed) Labs Reviewed - No data to display  EKG  EKG Interpretation None       Radiology No results found.  Procedures Procedures (including critical care time)  Medications Ordered in ED Medications  albuterol (PROVENTIL) (2.5 MG/3ML) 0.083% nebulizer solution 5 mg (5 mg Nebulization Given 12/16/16 1410)     Initial  Impression / Assessment and Plan / ED Course  I have reviewed the triage vital signs and the nursing notes.  Pertinent labs & imaging results that were available during my care of the patient were reviewed by me and considered in my medical decision making (see chart for details).     Patient presenting for routine trach care. Physical exam shows trach is in the correct place with no surrounding signs of infection or dislodgment. Patient requests nebulizer treatment. Physical exam shows no signs of systemic infection, and no concern for airway compromise. Sputum is clear and minimal. Discussed at length with patient importance of routine care at a trach clinic. Patient is to contact pulmonology for information about the trach clinic. Will route information to Anders SimmondsPete Babcock with [facility], who manages the trach clinic to assist with follow-up. At this time, patient appears safe for discharge. Discussed plan with patient patient. Return precautions given. Patient states he understands and agrees to plan.  Final Clinical Impressions(s) / ED Diagnoses   Final diagnoses:   Tracheostomy care [facility](HCC)    New Prescriptions Discharge Medication List as of 12/16/2016  2:49 PM     I personally performed the services described in this documentation, which was scribed in my presence. The recorded information has been reviewed and is accurate.     Alveria ApleyCaccavale, Hadlyn Amero, PA-C 12/16/16 1612    Phillis HaggisMabe, Martha L, MD 12/16/16 [id number]

## 2016-12-16 NOTE — ED Triage Notes (Signed)
[facility] patient) who is here for a new strap for his trach and a breathing treatment. Pt states the facility where he resides does not assist him with his trach needs adequately. Pt denies pain.

## 2016-12-27 ENCOUNTER — Encounter (HOSPITAL_COMMUNITY): Payer: Self-pay | Admitting: Emergency Medicine

## 2016-12-27 ENCOUNTER — Emergency Department (HOSPITAL_COMMUNITY)
Admission: EM | Admit: 2016-12-27 | Discharge: 2016-12-27 | Disposition: A | Discharge status: Home / Self Care | Payer: Medicare Other | Attending: Emergency Medicine | Admitting: Emergency Medicine

## 2016-12-27 DIAGNOSIS — J9509 Other tracheostomy complication: Secondary | ICD-10-CM | POA: Diagnosis present

## 2016-12-27 DIAGNOSIS — I10 Essential (primary) hypertension: Secondary | ICD-10-CM | POA: Diagnosis not present

## 2016-12-27 DIAGNOSIS — Z79899 Other long term (current) drug therapy: Secondary | ICD-10-CM | POA: Insufficient documentation

## 2016-12-27 MED ORDER — ALBUTEROL SULFATE (2.5 MG/3ML) 0.083% IN NEBU: 2.5000 mg | INHALATION_SOLUTION | Freq: Four times a day (QID) | RESPIRATORY_TRACT | 12 refills | Status: AC

## 2016-12-27 MED ORDER — ALBUTEROL SULFATE (2.5 MG/3ML) 0.083% IN NEBU
2.5000 mg | INHALATION_SOLUTION | Freq: Once | RESPIRATORY_TRACT | Status: AC
  Administered 2016-12-27: 2.5 mg via RESPIRATORY_TRACT
  Filled 2016-12-27: qty 3

## 2016-12-27 MED ORDER — NEBULIZER DEVI: 0 refills | Status: DC

## 2016-12-27 MED ORDER — NEBULIZER DEVI: 0 refills | Status: AC

## 2016-12-27 MED ORDER — ALBUTEROL SULFATE (2.5 MG/3ML) 0.083% IN NEBU: 2.5000 mg | INHALATION_SOLUTION | Freq: Four times a day (QID) | RESPIRATORY_TRACT | 12 refills | Status: DC | PRN

## 2016-12-27 NOTE — ED Notes (Addendum)
Report given to Clydie Braun, Charity fundraiser at [facility]. Made aware rx for nebulizer and nebulizer solution. Heritage manager for [employer]. Given Coke.

## 2016-12-27 NOTE — ED Triage Notes (Signed)
Pt. arrived with [facility] from [facility] , his tracheostomy was accidentally dislodged while getting out of the shower this morning but he was able to put it back , RT confirmed placement at arrival , denies SOB / respirations unlabored.

## 2016-12-27 NOTE — Progress Notes (Signed)
Patient assessed for airway patency and to be sure trach is in correct place. Brought in EMS, Room AiR SpO2 is 94%. Positive EtCO2 and patient has good BBS throughout. States he usually wear speaking vale with room air 24/7. No issues at this time. X ray pending for more positive placement confirmation.

## 2016-12-27 NOTE — ED Provider Notes (Signed)
[facility]-EMERGENCY DEPT Provider Note   CSN: [id number] Arrival date & time: 12/27/16  4098     History   Chief Complaint Chief Complaint  Patient presents with  . Tracheostmy Dislodged    HPI Theodore Knox is a 46 y.o. male.  Patient presents to the emergency department for evaluation of accidentally dislodged tracheostomy tube. He reports that he accidentally pulled out when he got out of the shower. No bleeding, pain, difficulty breathing.      Past Medical History:  Diagnosis Date  . Anxiety   . Dysphagia   . Hyperlipemia   . Hypertension   . Muscle weakness   . Stridor   . Subglottic stenosis   . TBI (traumatic brain injury) [facility])    age 12    There are no active problems to display for this patient.   Past Surgical History:  Procedure Laterality Date  . TRACHEAL DILITATION N/A 09/14/2016   Procedure: TRACHEAL DILITATION; UPSIZE TRACH;  Surgeon: Serena Colonel, MD;  Location: [facility] OR;  Service: ENT;  Laterality: N/A;  . TRACHEOSTOMY         Home Medications    Prior to Admission medications   Medication Sig Start Date End Date Taking? Authorizing Provider  acetaminophen (TYLENOL) 500 MG tablet Take 1,000 mg by mouth 2 (two) times daily as needed (for pain, headaches, or fever).    [provider]  albuterol (PROVENTIL) (2.5 MG/3ML) 0.083% nebulizer solution Take 3 mLs (2.5 mg total) by nebulization every 6 (six) hours as needed for wheezing or shortness of breath. 12/05/16   Maczis, Elmer Sow, PA-C  albuterol (PROVENTIL) (2.5 MG/3ML) 0.083% nebulizer solution Take 3 mLs (2.5 mg total) by nebulization every 6 (six) hours as needed for wheezing or shortness of breath. 12/27/16   Yannick Steuber, Canary Brim, MD  ARIPiprazole (ABILIFY) 15 MG tablet Take 15 mg by mouth daily.    [provider]  atorvastatin (LIPITOR) 20 MG tablet Take 20 mg by mouth every evening.    [provider]  clomiPRAMINE (ANAFRANIL) 50 MG capsule Take 100 mg by mouth  daily.    [provider]  diphenhydrAMINE (BENADRYL) 25 mg capsule Take 25 mg by mouth at bedtime as needed for sleep.    [provider]  hydrOXYzine (ATARAX/VISTARIL) 25 MG tablet Take 1 tablet (25 mg total) by mouth every 6 (six) hours as needed for anxiety. 12/02/16   Gilda Crease, MD  ibuprofen (ADVIL,MOTRIN) 600 MG tablet Take 1 tablet (600 mg total) by mouth every 8 (eight) hours as needed. Patient taking differently: Take 600 mg by mouth every 8 (eight) hours as needed for moderate pain.  08/24/16   [name], MD  ipratropium-albuterol (DUONEB) 0.5-2.5 (3) MG/3ML SOLN Take 3 mLs by nebulization every 4 (four) hours as needed (wheezing, cough). 11/22/16   Shaune Pollack, MD  lithium carbonate 300 MG capsule Take 300 mg by mouth every morning.    [provider]  Melatonin 3 MG TABS Take 6 mg by mouth at bedtime.    [provider]  methocarbamol (ROBAXIN) 500 MG tablet Take 1 tablet (500 mg total) by mouth every 8 (eight) hours as needed for muscle spasms. 08/24/16   [name], MD  metoprolol tartrate (LOPRESSOR) 50 MG tablet Take 75 mg by mouth daily.    [provider]  nystatin cream (MYCOSTATIN) Apply 1 application topically See admin instructions. TO BE APPLIED TO THE AFFECTED AREA(S) DAILY    [provider]  OLANZapine (  ZYPREXA) 5 MG tablet Take 5 mg by mouth 2 (two) times daily.    [provider]  Omega-3 Fatty Acids (FISH OIL) 1000 MG CAPS Take 1,000 mg by mouth daily.    [provider]  oxybutynin (DITROPAN) 5 MG tablet Take 5 mg by mouth daily.    [provider]  Respiratory Therapy Supplies (NEBULIZER) DEVI Use as directed  Please supply tubing and mask or T piece 12/27/16   Daney Moor, Canary Brim, MD    Family History No family history on file.  Social History Social History  Substance Use Topics  . Smoking status: Never Smoker  . Smokeless tobacco: Never Used  . Alcohol  use No     Allergies   Patient has no known allergies.   Review of Systems Review of Systems  Respiratory: Negative for shortness of breath.   All other systems reviewed and are negative.    Physical Exam Updated Vital Signs BP (!) 141/90 (BP Location: Right Arm)   Pulse 84   Temp 98.2 F (36.8 C) (Oral)   Resp 18   Ht 5\' 7"  (1.702 m)   Wt 95.3 kg (210 lb)   SpO2 90%   BMI 32.89 kg/m   Physical Exam  Constitutional: He is oriented to person, place, and time. He appears well-developed and well-nourished. No distress.  HENT:  Head: Normocephalic and atraumatic.  Right Ear: Hearing normal.  Left Ear: Hearing normal.  Nose: Nose normal.  Mouth/Throat: Oropharynx is clear and moist and mucous membranes are normal.  Eyes: Pupils are equal, round, and reactive to light. Conjunctivae and EOM are normal.  Neck: Normal range of motion. Neck supple.  Cardiovascular: Regular rhythm, S1 normal and S2 normal.  Exam reveals no gallop and no friction rub.   No murmur heard. Pulmonary/Chest: Effort normal and breath sounds normal. No respiratory distress. He exhibits no tenderness.  Abdominal: Soft. Normal appearance and bowel sounds are normal. There is no hepatosplenomegaly. There is no tenderness. There is no rebound, no guarding, no tenderness at McBurney's point and negative Murphy's sign. No hernia.  Musculoskeletal: Normal range of motion.  Neurological: He is alert and oriented to person, place, and time. He has normal strength. No cranial nerve deficit or sensory deficit. Coordination normal. GCS eye subscore is 4. GCS verbal subscore is 5. GCS motor subscore is 6.  Skin: Skin is warm, dry and intact. No rash noted. No cyanosis.  Psychiatric: He has a normal mood and affect. His speech is normal and behavior is normal. Thought content normal.  Nursing note and vitals reviewed.    ED Treatments / Results  Labs (all labs ordered are listed, but only abnormal results are  displayed) Labs Reviewed - No data to display  EKG  EKG Interpretation None       Radiology No results found.  Procedures Procedures (including critical care time)  Medications Ordered in ED Medications  albuterol (PROVENTIL) (2.5 MG/3ML) 0.083% nebulizer solution 2.5 mg (2.5 mg Nebulization Given 12/27/16 0657)     Initial Impression / Assessment and Plan / ED Course  I have reviewed the triage vital signs and the nursing notes.  Pertinent labs & imaging results that were available during my care of the patient were reviewed by me and considered in my medical decision making (see chart for details).     Patient presents with accidentally dislodged tracheostomy tube. This was replaced without difficulty by respiratory therapy. Patient appears well. Oxygen saturation is 92% on room  air. He reports that he thinks he needs a breathing treatment. No bronchospasm noted on examination. Patient was provided with treatment. He is asking for home nebulizer treatments. Will write prescription. Follow-up with primary care.  Final Clinical Impressions(s) / ED Diagnoses   Final diagnoses:  Other tracheostomy complication (HCC)    New Prescriptions New Prescriptions   ALBUTEROL (PROVENTIL) (2.5 MG/3ML) 0.083% NEBULIZER SOLUTION    Take 3 mLs (2.5 mg total) by nebulization every 6 (six) hours as needed for wheezing or shortness of breath.   RESPIRATORY THERAPY SUPPLIES (NEBULIZER) DEVI    Use as directed  Please supply tubing and mask or T piece     Gilda Crease, MD 12/27/16 0700

## 2016-12-27 NOTE — ED Notes (Signed)
Pt requesting rx change for albuterol every 6 hours, vs PRN. PA assisting with new rx for Dr. Blinda Leatherwood.

## 2016-12-31 ENCOUNTER — Emergency Department (HOSPITAL_COMMUNITY)
Admission: EM | Admit: 2016-12-31 | Discharge: 2016-12-31 | Disposition: A | Discharge status: Home / Self Care | Payer: Medicare Other | Attending: Emergency Medicine | Admitting: Emergency Medicine

## 2016-12-31 ENCOUNTER — Encounter (HOSPITAL_COMMUNITY): Payer: Self-pay | Admitting: Emergency Medicine

## 2016-12-31 ENCOUNTER — Emergency Department (HOSPITAL_COMMUNITY): Payer: Medicare Other

## 2016-12-31 DIAGNOSIS — Z430 Encounter for attention to tracheostomy: Secondary | ICD-10-CM | POA: Diagnosis not present

## 2016-12-31 DIAGNOSIS — R05 Cough: Secondary | ICD-10-CM | POA: Diagnosis not present

## 2016-12-31 DIAGNOSIS — R059 Cough, unspecified: Secondary | ICD-10-CM

## 2016-12-31 DIAGNOSIS — I10 Essential (primary) hypertension: Secondary | ICD-10-CM | POA: Diagnosis not present

## 2016-12-31 DIAGNOSIS — Z79899 Other long term (current) drug therapy: Secondary | ICD-10-CM | POA: Insufficient documentation

## 2016-12-31 NOTE — ED Notes (Signed)
[facility] called for transport.  

## 2016-12-31 NOTE — Progress Notes (Signed)
Positive color change on ETCO2 for trach placement.  Pt has strong non productive cough. Denies needing suctioning.  Replaced inner cannula and trach ties.  Secured and PMV in place.

## 2016-12-31 NOTE — ED Provider Notes (Signed)
[facility]PT Provider Note   CSN: [id number] Arrival date & time: 12/31/16  2011     History   Chief Complaint Chief Complaint  Patient presents with  . Trach Issue    HPI Theodore Knox is a 46 y.o. male with a tracheostomy presents today after reportedly removing his own trach. He reports that he has had a cough that is gradually worsening for the past month and that he feels like he cannot cough when he has his inner cannula for his trach in. He is requesting that his trach be changed. He reports that he was suctioned on the ambulance ride over and that did not help his cough or his symptoms.  HPI  Past Medical History:  Diagnosis Date  . Anxiety   . Dysphagia   . Hyperlipemia   . Hypertension   . Muscle weakness   . Stridor   . Subglottic stenosis   . TBI (traumatic brain injury) [facility])    age 25    There are no active problems to display for this patient.   Past Surgical History:  Procedure Laterality Date  . TRACHEAL DILITATION N/A 09/14/2016   Procedure: TRACHEAL DILITATION; UPSIZE TRACH;  Surgeon: Serena Colonel, MD;  Location: [facility] OR;  Service: ENT;  Laterality: N/A;  . TRACHEOSTOMY         Home Medications    Prior to Admission medications   Medication Sig Start Date End Date Taking? Authorizing Provider  acetaminophen (TYLENOL) 500 MG tablet Take 1,000 mg by mouth 2 (two) times daily as needed (for pain, headaches, or fever).    [provider]  albuterol (PROVENTIL) (2.5 MG/3ML) 0.083% nebulizer solution Take 3 mLs (2.5 mg total) by nebulization every 6 (six) hours as needed for wheezing or shortness of breath. 12/05/16   Maczis, Elmer Sow, PA-C  albuterol (PROVENTIL) (2.5 MG/3ML) 0.083% nebulizer solution Take 3 mLs (2.5 mg total) by nebulization every 6 (six) hours. 12/27/16   Bethel Born, PA-C  ARIPiprazole (ABILIFY) 15 MG tablet Take 15 mg by mouth daily.    [provider]  atorvastatin (LIPITOR) 20 MG tablet Take 20 mg by  mouth every evening.    [provider]  clomiPRAMINE (ANAFRANIL) 50 MG capsule Take 100 mg by mouth daily.    [provider]  diphenhydrAMINE (BENADRYL) 25 mg capsule Take 25 mg by mouth at bedtime as needed for sleep.    [provider]  hydrOXYzine (ATARAX/VISTARIL) 25 MG tablet Take 1 tablet (25 mg total) by mouth every 6 (six) hours as needed for anxiety. 12/02/16   Gilda Crease, MD  ibuprofen (ADVIL,MOTRIN) 600 MG tablet Take 1 tablet (600 mg total) by mouth every 8 (eight) hours as needed. Patient taking differently: Take 600 mg by mouth every 8 (eight) hours as needed for moderate pain.  08/24/16   [name], MD  ipratropium-albuterol (DUONEB) 0.5-2.5 (3) MG/3ML SOLN Take 3 mLs by nebulization every 4 (four) hours as needed (wheezing, cough). 11/22/16   Shaune Pollack, MD  lithium carbonate 300 MG capsule Take 300 mg by mouth every morning.    [provider]  Melatonin 3 MG TABS Take 6 mg by mouth at bedtime.    [provider]  methocarbamol (ROBAXIN) 500 MG tablet Take 1 tablet (500 mg total) by mouth every 8 (eight) hours as needed for muscle spasms. 08/24/16   [name], MD  metoprolol tartrate (LOPRESSOR) 50 MG tablet Take 75 mg by mouth daily.  [provider]  nystatin cream (MYCOSTATIN) Apply 1 application topically See admin instructions. TO BE APPLIED TO THE AFFECTED AREA(S) DAILY    [provider]  OLANZapine (ZYPREXA) 5 MG tablet Take 5 mg by mouth 2 (two) times daily.    [provider]  Omega-3 Fatty Acids (FISH OIL) 1000 MG CAPS Take 1,000 mg by mouth daily.    [provider]  oxybutynin (DITROPAN) 5 MG tablet Take 5 mg by mouth daily.    [provider]  Respiratory Therapy Supplies (NEBULIZER) DEVI Use as directed  Please supply tubing and mask or T piece 12/27/16   Bethel Born, PA-C    Family History No family history on file.  Social History Social  History  Substance Use Topics  . Smoking status: Never Smoker  . Smokeless tobacco: Never Used  . Alcohol use No     Allergies   Patient has no known allergies.   Review of Systems Review of Systems  Constitutional: Negative for chills and fever.  Respiratory: Positive for cough. Negative for chest tightness, shortness of breath and wheezing.        Trach care needed.   Cardiovascular: Negative for chest pain.     Physical Exam Updated Vital Signs BP 118/74   Pulse (!) 52   Temp 98.1 F (36.7 C) (Oral)   Resp 18   Ht 5\' 7"  (1.702 m)   Wt 95.3 kg (210 lb)   SpO2 96%   BMI 32.89 kg/m   Physical Exam  Constitutional: He appears well-developed and well-nourished. No distress.  HENT:  Head: Normocephalic and atraumatic.  Neck:  Trach in place.   Cardiovascular: Regular rhythm, S1 normal, S2 normal, normal heart sounds and normal pulses.   Pulmonary/Chest: Effort normal and breath sounds normal. No accessory muscle usage. No respiratory distress. He has no decreased breath sounds. He has no wheezes. He has no rhonchi. He has no rales.  Neurological: He is alert.  Skin: Skin is warm and dry. He is not diaphoretic.  Nursing note and vitals reviewed.    ED Treatments / Results  Labs (all labs ordered are listed, but only abnormal results are displayed) Labs Reviewed - No data to display  EKG  EKG Interpretation None       Radiology Dg Chest 2 View  Result Date: 12/31/2016 CLINICAL DATA:  Cough for 6 months.  Tracheostomy patient. EXAM: CHEST  2 VIEW COMPARISON:  Radiograph 11/22/2016 FINDINGS: Tracheostomy table the thoracic inlet. Scattered atelectasis or scarring. No confluent consolidation. Normal heart size and mediastinal contours. No pulmonary edema. No pleural fluid or pneumothorax. No acute osseous abnormalities. IMPRESSION: Scattered atelectasis or scarring. No consolidation to suggest pneumonia. Tracheostomy tube in place. Electronically Signed   By:  Rubye Oaks M.D.   On: 12/31/2016 22:46    Procedures Procedures (including critical care time)  Medications Ordered in ED Medications - No data to display   Initial Impression / Assessment and Plan / ED Course  I have reviewed the triage vital signs and the nursing notes.  Pertinent labs & imaging results that were available during my care of the patient were reviewed by me and considered in my medical decision making (see chart for details).    Theodore Knox presents for evaluation of cough, possible dislodged tracheostomy.  He was seen and evaluated by both myself, and respiratory. Respiratory reported that his trach was in place, as evidenced by color change on end tidal monitor.  Patient is  not having any difficulty breathing, or shortness of breath. He has normal breath sounds bilaterally. Due to occasional cough chest x-ray was obtained which did not show any concerning causes for his cough.    Final Clinical Impressions(s) / ED Diagnoses   Final diagnoses:  Tracheostomy care [facility])  Cough    New Prescriptions New Prescriptions   No medications on file     Theodore Knox 12/31/16 2351    Loren Racer, MD 01/03/17 [phone]

## 2016-12-31 NOTE — ED Triage Notes (Signed)
Pt brought in by [facility] from [facility]. [facility] reports the pt's trach became dislodged and his night time oxygen mask is broken. EMS reports the pt was just here for same. EMS reports vitals are stable and pt in no acute distress.   Pt reports his trach is dislodged and his mask is broken. Pt does not complain of any SOB or respiratory distress. Pt apologetic for the constant visits. Advised pt that it is no problem.

## 2016-12-31 NOTE — Discharge Instructions (Signed)
Please follow up with your primary care doctor if you have any concerns and for evaluation of your cough.

## 2017-02-05 ENCOUNTER — Emergency Department (HOSPITAL_COMMUNITY): Payer: Medicare Other

## 2017-02-05 ENCOUNTER — Encounter (HOSPITAL_COMMUNITY): Payer: Self-pay | Admitting: *Deleted

## 2017-02-05 ENCOUNTER — Emergency Department (HOSPITAL_COMMUNITY)
Admission: EM | Admit: 2017-02-05 | Discharge: 2017-02-05 | Disposition: A | Discharge status: Home / Self Care | Payer: Medicare Other | Attending: Emergency Medicine | Admitting: Emergency Medicine

## 2017-02-05 DIAGNOSIS — M25551 Pain in right hip: Secondary | ICD-10-CM | POA: Insufficient documentation

## 2017-02-05 DIAGNOSIS — I10 Essential (primary) hypertension: Secondary | ICD-10-CM | POA: Insufficient documentation

## 2017-02-05 DIAGNOSIS — Z79899 Other long term (current) drug therapy: Secondary | ICD-10-CM | POA: Diagnosis not present

## 2017-02-05 DIAGNOSIS — E785 Hyperlipidemia, unspecified: Secondary | ICD-10-CM | POA: Diagnosis not present

## 2017-02-05 MED ORDER — IBUPROFEN 600 MG PO TABS: 600.0000 mg | ORAL_TABLET | Freq: Three times a day (TID) | ORAL | 0 refills | Status: DC | PRN

## 2017-02-05 NOTE — ED Notes (Signed)
Patient transported to X-ray 

## 2017-02-05 NOTE — Care Management (Signed)
ED CM received call from patient stating that he was discharged from the ED a hour ago and was supposed to received a rolling walker but was not given a prescription.  CM spoke with EDP, rolling walker order placed in Epic. CM spoke with patient to offered choice, [facility] selected. Referral faxed to [phone] fax confirmation received. Updated patient. No further CM needs identified.

## 2017-02-05 NOTE — ED Provider Notes (Signed)
[facility] Provider Note   CSN: [id number] Arrival date & time: 02/05/17  1004     History   Chief Complaint Chief Complaint  Patient presents with  . Leg Pain    HPI Theodore Knox is a 46 y.o. male with a history of traumatic brain injury and muscle wasting who presents emergency department today for right hip pain. This is a new problem. Patient states that he was walking yesterday night when he felt a sharp pain of his right lateral hip. The pain continued as the patient ambulated and was only relieved when he sat down. Pain only recurs and the patient stands or when he is ambulating. He has not taken anything for this. He denies weakness/numbness/tingiling of the leg, low back pain, bowel or bladder incontinence, fever, chills, trauma, or fall.   HPI  Past Medical History:  Diagnosis Date  . Anxiety   . Dysphagia   . Hyperlipemia   . Hypertension   . Muscle weakness   . Stridor   . Subglottic stenosis   . TBI (traumatic brain injury) [facility])    age 62    There are no active problems to display for this patient.   Past Surgical History:  Procedure Laterality Date  . TRACHEAL DILITATION N/A 09/14/2016   Procedure: TRACHEAL DILITATION; UPSIZE TRACH;  Surgeon: Serena Colonel, MD;  Location[facility] OR;  Service: ENT;  Laterality: N/A;  . TRACHEOSTOMY         Home Medications    Prior to Admission medications   Medication Sig Start Date End Date Taking? Authorizing Provider  albuterol (PROVENTIL) (2.5 MG/3ML) 0.083% nebulizer solution Take 3 mLs (2.5 mg total) by nebulization every 6 (six) hours. 12/27/16  Yes Bethel Born, PA-C  ARIPiprazole (ABILIFY) 15 MG tablet Take 15 mg by mouth daily.   Yes [provider]  atorvastatin (LIPITOR) 20 MG tablet Take 40 mg by mouth daily.    Yes [provider]  clomiPRAMINE (ANAFRANIL) 50 MG capsule Take 100 mg by mouth daily.   Yes [provider]  lithium carbonate 300 MG capsule Take 300 mg  by mouth every morning.   Yes [provider]  Melatonin 3 MG TABS Take 6 mg by mouth at bedtime.   Yes [provider]  metoprolol tartrate (LOPRESSOR) 50 MG tablet Take 100 mg by mouth daily.    Yes [provider]  OLANZapine (ZYPREXA) 5 MG tablet Take 5 mg by mouth 2 (two) times daily.   Yes [provider]  Omega-3 Fatty Acids (FISH OIL) 1000 MG CAPS Take 1,000 mg by mouth daily.   Yes [provider]  oxybutynin (DITROPAN) 5 MG tablet Take 5 mg by mouth daily.   Yes [provider]  WELLNESS PROTEIN SHAKE PO Take 237 Containers by mouth 2 (two) times daily.   Yes [provider]  acetaminophen (TYLENOL) 500 MG tablet Take 1,000 mg by mouth 2 (two) times daily as needed (for pain, headaches, or fever).    [provider]  albuterol (PROVENTIL) (2.5 MG/3ML) 0.083% nebulizer solution Take 3 mLs (2.5 mg total) by nebulization every 6 (six) hours as needed for wheezing or shortness of breath. Patient not taking: Reported on 02/05/2017 12/05/16   Caffie Sotto, Elmer Sow, PA-C  diphenhydrAMINE (BENADRYL) 25 mg capsule Take 25 mg by mouth at bedtime as needed for sleep.    [provider]  hydrOXYzine (ATARAX/VISTARIL) 25 MG tablet Take 1 tablet (25 mg total) by mouth every  6 (six) hours as needed for anxiety. Patient not taking: Reported on 02/05/2017 12/02/16   Gilda Crease, MD  ibuprofen (ADVIL,MOTRIN) 600 MG tablet Take 1 tablet (600 mg total) by mouth every 8 (eight) hours as needed. 02/05/17   Brenley Priore, Elmer Sow, PA-C  ipratropium-albuterol (DUONEB) 0.5-2.5 (3) MG/3ML SOLN Take 3 mLs by nebulization every 4 (four) hours as needed (wheezing, cough). Patient not taking: Reported on 02/05/2017 11/22/16   Shaune Pollack, MD  methocarbamol (ROBAXIN) 500 MG tablet Take 1 tablet (500 mg total) by mouth every 8 (eight) hours as needed for muscle spasms. Patient not taking: Reported on 02/05/2017 08/24/16   [name], MD    nystatin cream (MYCOSTATIN) Apply 1 application topically See admin instructions. TO BE APPLIED TO THE AFFECTED AREA(S) DAILY    [provider]  Respiratory Therapy Supplies (NEBULIZER) DEVI Use as directed  Please supply tubing and mask or T piece 12/27/16   Bethel Born, PA-C    Family History No family history on file.  Social History Social History  Substance Use Topics  . Smoking status: Never Smoker  . Smokeless tobacco: Never Used  . Alcohol use No     Allergies   Patient has no known allergies.   Review of Systems Review of Systems  Constitutional: Negative for chills and fever.  Musculoskeletal: Positive for arthralgias and gait problem.  Skin: Negative for color change.  Neurological: Negative for weakness and numbness.     Physical Exam Updated Vital Signs BP 123/88 (BP Location: Right Arm)   Pulse 82   Temp 98.4 F (36.9 C) (Oral)   Resp 17   Ht  (1.702 m)   Wt 90.7 kg (200 lb)   SpO2 93%   BMI 31.32 kg/m   Physical Exam  Constitutional: He appears well-developed and well-nourished.  HENT:  Head: Normocephalic and atraumatic.  Right Ear: External ear normal.  Left Ear: External ear normal.  Eyes: Conjunctivae are normal. Right eye exhibits no discharge. Left eye exhibits no discharge. No scleral icterus.  Cardiovascular:  Pulses:      Dorsalis pedis pulses are 2+ on the right side, and 2+ on the left side.       Posterior tibial pulses are 2+ on the right side, and 2+ on the left side.  Swelling or tenderness. No edema. Calves are equal in size bilaterally.  Pulmonary/Chest: Effort normal. No respiratory distress.  Musculoskeletal:       Right hip: He exhibits tenderness. He exhibits normal range of motion, normal strength and no swelling.       Right knee: Normal.       Lumbar back: Normal.       Legs: Negative log roll test. No sacral crepitus.  Neurological: He is alert. He has normal strength.  Gait able but noted  to be painful.   Skin: Skin is warm, dry and intact. Capillary refill takes less than 2 seconds. No erythema. No pallor.  Psychiatric: He has a normal mood and affect.  Nursing note and vitals reviewed.    ED Treatments / Results  Labs (all labs ordered are listed, but only abnormal results are displayed) Labs Reviewed - No data to display  EKG  EKG Interpretation None       Radiology Dg Hip Unilat With Pelvis 2-3 Views Right  Result Date: 02/05/2017 CLINICAL DATA:  Right hip pain with standing and ambulation. Symptoms began last night. EXAM: DG HIP (WITH OR WITHOUT PELVIS) 2-3V RIGHT  COMPARISON:  None. FINDINGS: There is no evidence of hip fracture or dislocation. There is no evidence of arthropathy or other focal bone abnormality. IMPRESSION: Negative. Electronically Signed   By: Marnee Spring M.D.   On: 02/05/2017 13:36    Procedures Procedures (including critical care time)  Medications Ordered in ED Medications - No data to display   Initial Impression / Assessment and Plan / ED Course  I have reviewed the triage vital signs and the nursing notes.  Pertinent labs & imaging results that were available during my care of the patient were reviewed by me and considered in my medical decision making (see chart for details).     46 year old male with right hip pain x 1 day. Patient is without fever, decreased ROM, erythema or swelling to suggest septic joint. Patient X-Ray negative for obvious fracture or dislocation. Patient refused crutches while in the ED. Pt advised to follow up with orthopedics if symptoms persist for possibility of missed fracture diagnosis vs further evaluation. Conservative therapy recommended and discussed. Patient will be dc home & is agreeable with above plan.  Final Clinical Impressions(s) / ED Diagnoses   Final diagnoses:  Right hip pain    New Prescriptions New Prescriptions   IBUPROFEN (ADVIL,MOTRIN) 600 MG TABLET    Take 1 tablet (600  mg total) by mouth every 8 (eight) hours as needed.     Jacinto Halim, PA-C 02/05/17 1603    Eber Hong, MD 02/08/17 [phone]

## 2017-02-05 NOTE — Discharge Instructions (Signed)
Please read and follow all provided instructions.  You have been seen today for right hip pain  Tests performed today include: An x-ray of the affected area - does NOT show any broken bones or dislocations.  Vital signs. See below for your results today.   Home care instructions: -- *PRICE in the first 24-48 hours after injury: Protect (with brace, splint, sling), if given by your provider Rest Ice- Do not apply ice pack directly to your skin, place towel or similar between your skin and ice/ice pack. Apply ice for 20 min, then remove for 40 min while awake Compression- Wear brace, elastic bandage, splint as directed by your provider Elevate affected extremity above the level of your heart when not walking around for the first 24-48 hours   Use Ibuprofen (Motrin/Advil)  every 6 hours as needed for pain (do not exceed max dose in 24 hours, )  Follow-up instructions: Please follow-up with your primary care provider or the provided orthopedic physician (bone specialist) if you continue to have significant pain in 1 week. In this case you may have a more severe injury that requires further care.   Return instructions:  Please return if your toes or feet are numb or tingling, appear gray or blue, or you have severe pain (also elevate the leg and loosen splint or wrap if you were given one) Please return to the Emergency Department if you experience worsening symptoms.  Please return if you have any other emergent concerns. Additional Information:  Your vital signs today were: BP 123/88 (BP Location: Right Arm)    Pulse 82    Temp 98.4 F (36.9 C) (Oral)    Resp 17    Ht  (1.702 m)    Wt 90.7 kg (200 lb)    SpO2 93%    BMI 31.32 kg/m  If your blood pressure (BP) was elevated above 135/85 this visit, please have this repeated by your doctor within one month. ---------------

## 2017-02-05 NOTE — ED Triage Notes (Addendum)
To ED via [facility] from [facility] for eval of outter leg pain. Pt ambulatory without difficulty. Pt is in facility for rehab. Chronic trach pt. Pt ambulatory. Denies injury. Pain is pinpoint and nonradiating. States his leg went to sleep last night but then eased up.

## 2017-02-06 ENCOUNTER — Encounter: Payer: Self-pay | Admitting: *Deleted

## 2017-02-06 NOTE — Discharge Planning (Signed)
Pt called to check on status of wheelchair fax to DME agency.  EDCM reviewed chart to find that [facility] faxed information with confirmation that it had beed received.

## 2017-02-23 ENCOUNTER — Emergency Department (HOSPITAL_COMMUNITY)
Admission: EM | Admit: 2017-02-23 | Discharge: 2017-02-24 | Disposition: A | Discharge status: Home / Self Care | Payer: Medicare Other | Attending: Emergency Medicine | Admitting: Emergency Medicine

## 2017-02-23 DIAGNOSIS — I10 Essential (primary) hypertension: Secondary | ICD-10-CM | POA: Diagnosis not present

## 2017-02-23 DIAGNOSIS — Z87820 Personal history of traumatic brain injury: Secondary | ICD-10-CM | POA: Diagnosis not present

## 2017-02-23 DIAGNOSIS — J9503 Malfunction of tracheostomy stoma: Secondary | ICD-10-CM | POA: Diagnosis present

## 2017-02-23 DIAGNOSIS — Z79899 Other long term (current) drug therapy: Secondary | ICD-10-CM | POA: Diagnosis not present

## 2017-02-23 NOTE — ED Triage Notes (Signed)
Pt arrived via [facility]. Pt has no complaints at this time. Pt needed replacement trach components. Pt alert and oriented x4.

## 2017-02-24 NOTE — ED Notes (Signed)
PATIENT IN POD F: waiting for PTAR, watching TV

## 2017-02-24 NOTE — ED Provider Notes (Signed)
[facility] Provider Note   CSN: [id number] Arrival date & time: 02/23/17  2029     History   Chief Complaint Chief Complaint  Patient presents with  . Tracheostomy Tube Change    HPI Theodore Knox is a 46 y.o. male.  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 Pt was cleaning the inner cannula of his trach today when it ripped. He does not have a replacement. He is o/w well, without complaints. Felt fine prior to cleaning his cannula. He's had no increase in cough or SOB. No increased sputum production.   Past Medical History:  Diagnosis Date  . Anxiety   . Dysphagia   . Hyperlipemia   . Hypertension   . Muscle weakness   . Stridor   . Subglottic stenosis   . TBI (traumatic brain injury) [facility])    age 46    There are no active problems to display for this patient.   Past Surgical History:  Procedure Laterality Date  . TRACHEAL DILITATION N/A 09/14/2016   Procedure: TRACHEAL DILITATION; UPSIZE TRACH;  Surgeon: Serena Colonelosen, Jefry, MD;  Location: [facility] OR;  Service: ENT;  Laterality: N/A;  . TRACHEOSTOMY         Home Medications    Prior to Admission medications   Medication Sig Start Date End Date Taking? Authorizing Provider  acetaminophen (TYLENOL) 500 MG tablet Take 1,000 mg by mouth 2 (two) times daily as needed (for pain, headaches, or fever).    [provider]  albuterol (PROVENTIL) (2.5 MG/3ML) 0.083% nebulizer solution Take 3 mLs (2.5 mg total) by nebulization every 6 (six) hours as needed for wheezing or shortness of breath. Patient not taking: Reported on 02/05/2017 12/05/16   Maczis, Elmer SowMichael M, PA-C  albuterol (PROVENTIL) (2.5 MG/3ML) 0.083% nebulizer solution Take 3 mLs (2.5 mg total) by nebulization every 6 (six) hours. 12/27/16   Bethel BornGekas, Kelly Marie, PA-C  ARIPiprazole (ABILIFY) 15 MG tablet Take 15 mg by mouth daily.    [provider]  atorvastatin  (LIPITOR) 20 MG tablet Take 40 mg by mouth daily.     [provider]  clomiPRAMINE (ANAFRANIL) 50 MG capsule Take 100 mg by mouth daily.    [provider]  diphenhydrAMINE (BENADRYL) 25 mg capsule Take 25 mg by mouth at bedtime as needed for sleep.    [provider]  hydrOXYzine (ATARAX/VISTARIL) 25 MG tablet Take 1 tablet (25 mg total) by mouth every 6 (six) hours as needed for anxiety. Patient not taking: Reported on 02/05/2017 12/02/16   Gilda CreasePollina, Christopher J, MD  ibuprofen (ADVIL,MOTRIN) 600 MG tablet Take 1 tablet (600 mg total) by mouth every 8 (eight) hours as needed. 02/05/17   Maczis, Elmer SowMichael M, PA-C  ipratropium-albuterol (DUONEB) 0.5-2.5 (3) MG/3ML SOLN Take 3 mLs by nebulization every 4 (four) hours as needed (wheezing, cough). Patient not taking: Reported on 02/05/2017 11/22/16   Shaune PollackIsaacs, Nat Lowenthal, MD  lithium carbonate 300 MG capsule Take 300 mg by mouth every morning.    [provider]  Melatonin 3 MG TABS Take 6 mg by mouth at bedtime.    [provider]  methocarbamol (ROBAXIN) 500 MG tablet Take 1 tablet (500 mg total) by mouth every 8 (eight) hours as needed for muscle spasms. Patient not taking: Reported on 02/05/2017 08/24/16   Azalia Bilisampos, [name], MD  metoprolol tartrate (LOPRESSOR) 50 MG tablet Take 100 mg by mouth daily.  [provider]  nystatin cream (MYCOSTATIN) Apply 1 application topically See admin instructions. TO BE APPLIED TO THE AFFECTED AREA(S) DAILY    [provider]  OLANZapine (ZYPREXA) 5 MG tablet Take 5 mg by mouth 2 (two) times daily.    [provider]  Omega-3 Fatty Acids (FISH OIL) 1000 MG CAPS Take 1,000 mg by mouth daily.    [provider]  oxybutynin (DITROPAN) 5 MG tablet Take 5 mg by mouth daily.    [provider]  Respiratory Therapy Supplies (NEBULIZER) DEVI Use as directed  Please supply tubing and mask or T piece 12/27/16   Bethel Born, PA-C  WELLNESS  PROTEIN SHAKE PO Take 237 Containers by mouth 2 (two) times daily.    [provider]    Family History No family history on file.  Social History Social History  Substance Use Topics  . Smoking status: Never Smoker  . Smokeless tobacco: Never Used  . Alcohol use No     Allergies   Patient has no known allergies.   Review of Systems Review of Systems  Constitutional: Negative for chills, fatigue and fever.  HENT: Negative for congestion and rhinorrhea.   Eyes: Negative for visual disturbance.  Respiratory: Negative for cough, shortness of breath and wheezing.   Cardiovascular: Negative for chest pain and leg swelling.  Gastrointestinal: Negative for abdominal pain, diarrhea, nausea and vomiting.  Genitourinary: Negative for dysuria and flank pain.  Musculoskeletal: Negative for neck pain and neck stiffness.  Skin: Negative for rash and wound.  Allergic/Immunologic: Negative for immunocompromised state.  Neurological: Negative for syncope, weakness and headaches.  All other systems reviewed and are negative.    Physical Exam Updated Vital Signs BP 133/72 (BP Location: Left Arm)   Pulse 84   Temp 99.2 F (37.3 C) (Oral)   Resp 20   Ht 5\' 7"  (1.702 m)   Wt 95.3 kg (210 lb)   SpO2 96%   BMI 32.89 kg/m   Physical Exam  Constitutional: He is oriented to person, place, and time. He appears well-developed and well-nourished. No distress.  HENT:  Head: Normocephalic and atraumatic.  Eyes: Conjunctivae are normal.  Neck: Neck supple.  Cuffed trach in place. No drainage or bleeding. No IC noted in 6-0 trach.  Cardiovascular: Normal rate, regular rhythm and normal heart sounds.   Pulmonary/Chest: Effort normal. No respiratory distress. He has no wheezes.  Abdominal: He exhibits no distension.  Musculoskeletal: He exhibits no edema.  Neurological: He is alert and oriented to person, place, and time. He exhibits normal muscle tone.  Skin: Skin is warm.  Capillary refill takes less than 2 seconds. No rash noted.  Nursing note and vitals reviewed.    ED Treatments / Results  Labs (all labs ordered are listed, but only abnormal results are displayed) Labs Reviewed - No data to display  EKG  EKG Interpretation None       Radiology No results found.  Procedures Procedures (including critical care time)  Medications Ordered in ED Medications - No data to display   Initial Impression / Assessment and Plan / ED Course  I have reviewed the triage vital signs and the nursing notes.  Pertinent labs & imaging results that were available during my care of the patient were reviewed by me and considered in my medical decision making (see chart for details).     46 yo M here after accidentally destroying inner canula of his trach. He is o/w well  and in NAD. Lungs clear, trach site c/d/i. IC replaced by RT and pt provided with additional supplies for home. No other complaints. D/c home.  Final Clinical Impressions(s) / ED Diagnoses   Final diagnoses:  Tracheostomy malfunction [facility])    New Prescriptions Discharge Medication List as of 02/24/2017 12:54 AM       Shaune Pollack, MD 02/24/17 [phone]

## 2017-02-24 NOTE — ED Notes (Signed)
Patient currently ambulatory without difficulty, not requiring oxygen, alert and oriented x4, and states he is independent normally. Currently patient waiting for transportation via [facility] with extended/unknown ETA assisted living. [facility] to ensure patient was okay to be transported by other means to their facility. Staff there assured me that they will be there to receive the patient and that he is normally independent. Taxi voucher provided with endorsement from [place]

## 2017-02-24 NOTE — ED Notes (Signed)
Cancelled [facility] 

## 2017-03-05 ENCOUNTER — Observation Stay (HOSPITAL_COMMUNITY)
Admission: EM | Admit: 2017-03-05 | Discharge: 2017-03-06 | Disposition: A | Discharge status: Home / Self Care | Payer: Medicare Other | Attending: Otolaryngology | Admitting: Otolaryngology

## 2017-03-05 ENCOUNTER — Encounter (HOSPITAL_COMMUNITY): Payer: Self-pay | Admitting: Emergency Medicine

## 2017-03-05 ENCOUNTER — Emergency Department (HOSPITAL_COMMUNITY): Payer: Medicare Other

## 2017-03-05 DIAGNOSIS — I10 Essential (primary) hypertension: Secondary | ICD-10-CM | POA: Diagnosis not present

## 2017-03-05 DIAGNOSIS — R042 Hemoptysis: Secondary | ICD-10-CM | POA: Diagnosis not present

## 2017-03-05 DIAGNOSIS — Z79899 Other long term (current) drug therapy: Secondary | ICD-10-CM | POA: Insufficient documentation

## 2017-03-05 DIAGNOSIS — E785 Hyperlipidemia, unspecified: Secondary | ICD-10-CM | POA: Insufficient documentation

## 2017-03-05 DIAGNOSIS — J9501 Hemorrhage from tracheostomy stoma: Secondary | ICD-10-CM | POA: Diagnosis not present

## 2017-03-05 DIAGNOSIS — Y833 Surgical operation with formation of external stoma as the cause of abnormal reaction of the patient, or of later complication, without mention of misadventure at the time of the procedure: Secondary | ICD-10-CM | POA: Diagnosis not present

## 2017-03-05 DIAGNOSIS — J9500 Unspecified tracheostomy complication: Secondary | ICD-10-CM

## 2017-03-05 LAB — BASIC METABOLIC PANEL
ANION GAP: 8 (ref 5–15)
BUN: 14 mg/dL (ref 6–20)
CALCIUM: 9.3 mg/dL (ref 8.9–10.3)
CO2: 25 mmol/L (ref 22–32)
Chloride: 105 mmol/L (ref 101–111)
Creatinine, Ser: 0.95 mg/dL (ref 0.61–1.24)
GFR calc Af Amer: >60 mL/min (ref >60)
Glucose, Bld: 99 mg/dL (ref 65–99)
POTASSIUM: 4.2 mmol/L (ref 3.5–5.1)
SODIUM: 138 mmol/L (ref 135–145)

## 2017-03-05 LAB — CBC WITH DIFFERENTIAL/PLATELET
BASOS ABS: 0 10*3/uL (ref 0.0–0.1)
Basophils Relative: 0 %
EOS ABS: 0.2 10*3/uL (ref 0.0–0.7)
EOS PCT: 2 %
HCT: 46.7 % (ref 39.0–52.0)
Hemoglobin: 16 g/dL (ref 13.0–17.0)
Lymphocytes Relative: 35 %
Lymphs Abs: 3.7 10*3/uL (ref 0.7–4.0)
MCH: 29.9 pg (ref 26.0–34.0)
MCHC: 34.3 g/dL (ref 30.0–36.0)
MCV: 87.1 fL (ref 78.0–100.0)
Monocytes Absolute: 0.7 10*3/uL (ref 0.1–1.0)
Monocytes Relative: 6 %
Neutro Abs: 5.9 10*3/uL (ref 1.7–7.7)
Neutrophils Relative %: 57 %
PLATELETS: 229 10*3/uL (ref 150–400)
RBC: 5.36 MIL/uL (ref 4.22–5.81)
RDW: 13 % (ref 11.5–15.5)
WBC: 10.5 10*3/uL (ref 4.0–10.5)

## 2017-03-05 LAB — PROTIME-INR
INR: 1.08
PROTHROMBIN TIME: 13.9 s (ref 11.4–15.2)

## 2017-03-05 MED ORDER — ACETAMINOPHEN 500 MG PO TABS
1000.0000 mg | ORAL_TABLET | Freq: Once | ORAL | Status: AC
  Administered 2017-03-05: 1000 mg via ORAL
  Filled 2017-03-05: qty 2

## 2017-03-05 MED ORDER — OLANZAPINE 5 MG PO TABS
5.0000 mg | ORAL_TABLET | Freq: Two times a day (BID) | ORAL | Status: DC
  Administered 2017-03-05: 5 mg via ORAL
  Filled 2017-03-05 (×3): qty 1

## 2017-03-05 NOTE — ED Notes (Signed)
On assessment pt states that he has been coughing up red blood out of his trach. Pt denies any pain or discomfort around the trach site. PMV is on. Pt is stable at this time no distress or complications noted. No respiratory compromise noted. PA at bedside

## 2017-03-05 NOTE — ED Notes (Signed)
Patient transported to X-ray 

## 2017-03-05 NOTE — ED Provider Notes (Signed)
[facility] EMERGENCY DEPARTMENT Provider Note   CSN: [id number] Arrival date & time: 03/05/17  2016    History   Chief Complaint Chief Complaint  Patient presents with  . Blood from Trach    HPI Theodore Knox is a 46 y.o. male.  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  He states that he was cleaning his inner cannula today when he noticed some dried blood.  He was also able to cough up a small amount of bright red blood as well.  Inner cannula last changed on 02/24/2017.  Patient denies any fevers, shortness of breath, pain.  He expresses concern for infection as this was the case when he last experienced hemoptysis. Patient resides at [facility].   The history is provided by the patient. No language interpreter was used.    Past Medical History:  Diagnosis Date  . Anxiety   . Dysphagia   . Hyperlipemia   . Hypertension   . Muscle weakness   . Stridor   . Subglottic stenosis   . TBI (traumatic brain injury) [facility])    age 58    Patient Active Problem List   Diagnosis Date Noted  . Cough with hemoptysis 03/06/2017  . Tracheostomy complication (HCC) 03/06/2017    Past Surgical History:  Procedure Laterality Date  . TRACHEAL DILITATION N/A 09/14/2016   Procedure: TRACHEAL DILITATION; UPSIZE TRACH;  Surgeon: Serena Colonel, MD;  Location: [facility] OR;  Service: ENT;  Laterality: N/A;  . TRACHEOSTOMY        Home Medications    Prior to Admission medications   Medication Sig Start Date End Date Taking? Authorizing Provider  acetaminophen (TYLENOL) 500 MG tablet Take 1,000 mg by mouth 2 (two) times daily as needed (for pain, headaches, or fever).   Yes [provider]  albuterol (PROVENTIL) (2.5 MG/3ML) 0.083% nebulizer solution Take 3 mLs (2.5 mg total) by nebulization every 6 (six) hours.  12/27/16  Yes Bethel Born, PA-C  ARIPiprazole (ABILIFY) 15 MG tablet Take 15 mg by mouth daily.   Yes [provider]  atorvastatin (LIPITOR) 20 MG tablet Take 40 mg by mouth daily.    Yes [provider]  clomiPRAMINE (ANAFRANIL) 50 MG capsule Take 100 mg by mouth daily.   Yes [provider]  diphenhydrAMINE (BENADRYL) 25 mg capsule Take 25 mg by mouth at bedtime as needed for sleep.   Yes [provider]  lithium carbonate 300 MG capsule Take 300 mg by mouth every morning.   Yes [provider]  Melatonin 3 MG TABS Take 6 mg by mouth at bedtime.   Yes [provider]  metoprolol succinate (TOPROL-XL) 100 MG 24 hr tablet Take 100 mg by mouth daily. Take with or immediately following a meal.   Yes [provider]  OLANZapine (ZYPREXA) 5 MG tablet Take 5 mg by mouth 2 (two) times daily.   Yes [provider]  Omega-3 Fatty Acids (FISH OIL) 1000 MG CAPS Take 1,000 mg by mouth daily.   Yes [provider]  oxybutynin (DITROPAN) 5 MG tablet Take 5 mg by mouth daily.   Yes [provider]  Respiratory Therapy Supplies (NEBULIZER) DEVI Use as directed  Please supply tubing and mask or T piece 12/27/16  Yes Gekas, Jory Sims, PA-C  WELLNESS PROTEIN SHAKE PO Take 237 Containers by mouth 2 (  two) times daily.   Yes [provider]    Family History No family history on file.  Social History Social History  Substance Use Topics  . Smoking status: Never Smoker  . Smokeless tobacco: Never Used  . Alcohol use No     Allergies   Patient has no known allergies.   Review of Systems Review of Systems Ten systems reviewed and are negative for acute change, except as noted in the HPI.    Physical Exam Updated Vital Signs BP 125/77   Pulse 77   Temp 99.3 F (37.4 C) (Oral)   Resp (!) 9   Ht 5\' 7"  (1.702 m)   Wt 95.3 kg (210 lb)   SpO2 92%   BMI 32.89 kg/m   Physical Exam    Constitutional: He is oriented to person, place, and time. He appears well-developed and well-nourished. No distress.  Nontoxic appearing and in NAD  HENT:  Head: Normocephalic and atraumatic.  Eyes: Conjunctivae and EOM are normal. No scleral icterus.  Neck: Normal range of motion.  Cuffed trach (6-0 Shiley XLT) in place. Scant brown sputum to end of inner cannula.  Cardiovascular: Normal rate, regular rhythm and intact distal pulses.   Pulmonary/Chest: Effort normal. No respiratory distress.  Respirations even and unlabored. Scant wheezes and rhonchi in posterior RLL. No hypoxia on room air.  Musculoskeletal: Normal range of motion.  Neurological: He is alert and oriented to person, place, and time. He exhibits normal muscle tone. Coordination normal.  Skin: Skin is warm and dry. No rash noted. He is not diaphoretic. No erythema. No pallor.  Psychiatric: He has a normal mood and affect. His behavior is normal.  Nursing note and vitals reviewed.    ED Treatments / Results  Labs (all labs ordered are listed, but only abnormal results are displayed) Labs Reviewed  CULTURE, RESPIRATORY (NON-EXPECTORATED)  CBC WITH DIFFERENTIAL/PLATELET  BASIC METABOLIC PANEL  PROTIME-INR    EKG  EKG Interpretation None       Radiology Dg Chest 2 View  Result Date: 03/05/2017 CLINICAL DATA:  Blood within inner tracheostomy cannula. EXAM: CHEST  2 VIEW COMPARISON:  Chest radiograph 12/31/2016 FINDINGS: Tracheostomy tube is in unchanged position. Bibasilar atelectasis without focal consolidation. Normal cardiomediastinal contours. No pleural effusion or pneumothorax. No pulmonary edema. IMPRESSION: Unchanged appearance tracheostomy tube compared to 12/31/2016. No focal airspace disease. Electronically Signed   By: Deatra RobinsonKevin  Herman M.D.   On: 03/05/2017 22:05   Ct Angio Neck W And/or Wo Contrast  Result Date: 03/06/2017 CLINICAL DATA:  Acute bleeding at tracheostomy site. Status history of  subglottic stenosis and stridor. EXAM: CT ANGIOGRAPHY NECK TECHNIQUE: Multidetector CT imaging of the neck was performed using the standard protocol during bolus administration of intravenous contrast. Multiplanar CT image reconstructions and MIPs were obtained to evaluate the vascular anatomy. Carotid stenosis measurements (when applicable) are obtained utilizing NASCET criteria, using the distal internal carotid diameter as the denominator. CONTRAST:  50 cc Isovue 370 COMPARISON:  None. FINDINGS: AORTIC ARCH: Normal appearance of the thoracic arch, normal branch pattern. The origins of the innominate, left Common carotid artery and subclavian artery are widely patent. RIGHT CAROTID SYSTEM: Common carotid artery is widely patent, coursing in a straight line fashion. Normal appearance of the carotid bifurcation without hemodynamically significant stenosis by NASCET criteria. Normal appearance of the included internal carotid artery, tonsillar loop. LEFT CAROTID SYSTEM: Common carotid artery is widely patent, coursing in a straight line fashion. Normal appearance of the carotid bifurcation  without hemodynamically significant stenosis by NASCET criteria, trace calcific atherosclerosis. Normal appearance of the included internal carotid artery, tonsillar loop. VERTEBRAL ARTERIES:RIGHT vertebral artery is dominant. Normal appearance of the vertebral arteries, which appear widely patent. SKELETON: No acute osseous process though bone windows have not been submitted. Severe C4-5 through C7-T1 cervical spondylosis. OTHER NECK: Tracheostomy tube above the carina. 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 Small amount of probable hemorrhage superior mediastinum, inferior to the thyroid lobes and contiguous with tracheostomy tube. No drainable fluid collection. Effaced hypopharynx and apposition the true vocal cords. UPPER CHEST: Included lung apices  are clear. No superior mediastinal lymphadenopathy. IMPRESSION: 1. No acute vascular process in the neck. No hemodynamically significant stenosis. 2. Tracheostomy tube with small amount of hemorrhage inferiorly, likely venous etiology. No drainable fluid collection. 3. Effaced hypopharynx, apposition of the true vocal cords effacing airway. Electronically Signed   By: Awilda Metro M.D.   On: 03/06/2017 01:01    Procedures Procedures (including critical care time)  Medications Ordered in ED Medications  OLANZapine (ZYPREXA) tablet 5 mg (5 mg Oral Given 03/05/17 2345)  acetaminophen (TYLENOL) tablet 1,000 mg (1,000 mg Oral Given 03/05/17 2345)  iopamidol (ISOVUE-370) 76 % injection (50 mLs  Contrast Given 03/06/17 0028)     Initial Impression / Assessment and Plan / ED Course  I have reviewed the triage vital signs and the nursing notes.  Pertinent labs & imaging results that were available during my care of the patient were reviewed by me and considered in my medical decision making (see chart for details).     12:05 AM Attempted to change the patient's tracheostomy tube as this was last changed in August.  Bougie was placed down the stoma and patient coughed as trach flange was being removed. Patient with acute onset of bleed from tracheostomy stoma; moderate quantity including clots. Patient with moderate amount of distress. No pulsatile bleeding and this appears to have subsided after placement of new uncuffed 6-0 Shiley XLT. Patient with good oxygenation post replacement; positive color change on colorimetric detector.  12:22 AM Case discussed with Dr. Annalee Genta. Dr. Annalee Genta aware of situation surrounding trach change and quantity of blood expectorated. Dr. Annalee Genta does not see indication for urgent or emergent scope as patient is currently stable and maintaining oxygen saturations. Dr. Annalee Genta states that he believes the bleeding is due to granulation tissue. Dr. Eudelia Bunch has  discussed this with Dr. Annalee Genta as well. Will proceed with CTA and repeat consult if imaging shows concerning source of bleeding.  12:45 AM Received call back from Dr. Annalee Genta. He is on his way to the ED to evaluate patient and scope. ENT cart and flexible scope at bedside. SpO2 92-96% on room air. Patient stable with no current c/o SOB. CT angio has been completed.  12:50 AM Dr. Annalee Genta at bedside.  1:05 AM CT angio neck shows tracheostomy tube with small amount of hemorrhage inferiorly, suspected to be venous etiology. No drainable fluid collection.  1:15 AM Dr. Annalee Genta states that scope is reassuring. Plan for ENT admission for observation. Dr. Annalee Genta made aware of CT angio results. We greatly appreciate his assistance in the care of this patient.   Final Clinical Impressions(s) / ED Diagnoses   Final diagnoses:  Tracheostomy complication, unspecified complication type [facility])    New Prescriptions New Prescriptions   No medications on file     Antony Madura, Cordelia Poche 03/06/17 0117    Nira Conn, MD 03/11/17  [id number]  

## 2017-03-05 NOTE — ED Triage Notes (Signed)
[facility] for complaints of bright red blood when suctioning his trach.

## 2017-03-06 ENCOUNTER — Emergency Department (HOSPITAL_COMMUNITY): Payer: Medicare Other

## 2017-03-06 DIAGNOSIS — R042 Hemoptysis: Secondary | ICD-10-CM

## 2017-03-06 DIAGNOSIS — J9500 Unspecified tracheostomy complication: Secondary | ICD-10-CM

## 2017-03-06 DIAGNOSIS — J9501 Hemorrhage from tracheostomy stoma: Secondary | ICD-10-CM | POA: Diagnosis not present

## 2017-03-06 MED ORDER — ATORVASTATIN CALCIUM 40 MG PO TABS
40.0000 mg | ORAL_TABLET | Freq: Every day | ORAL | Status: DC
  Filled 2017-03-06: qty 1

## 2017-03-06 MED ORDER — MELATONIN 3 MG PO TABS
6.0000 mg | ORAL_TABLET | Freq: Every day | ORAL | Status: DC
Start: 2017-03-06 — End: 2017-03-06
  Filled 2017-03-06: qty 2

## 2017-03-06 MED ORDER — OMEGA-3-ACID ETHYL ESTERS 1 G PO CAPS
1000.0000 mg | ORAL_CAPSULE | Freq: Every day | ORAL | Status: DC
  Filled 2017-03-06: qty 1

## 2017-03-06 MED ORDER — ACETAMINOPHEN 500 MG PO TABS: 1000.0000 mg | ORAL_TABLET | Freq: Two times a day (BID) | ORAL | Status: DC | PRN

## 2017-03-06 MED ORDER — LITHIUM CARBONATE 300 MG PO CAPS
300.0000 mg | ORAL_CAPSULE | Freq: Every morning | ORAL | Status: DC
  Filled 2017-03-06: qty 1

## 2017-03-06 MED ORDER — IOPAMIDOL (ISOVUE-370) INJECTION 76%
INTRAVENOUS | Status: AC
  Administered 2017-03-06: 50 mL
  Filled 2017-03-06: qty 50

## 2017-03-06 MED ORDER — DIPHENHYDRAMINE HCL 25 MG PO CAPS: 25.0000 mg | ORAL_CAPSULE | Freq: Every evening | ORAL | Status: DC | PRN

## 2017-03-06 MED ORDER — ARIPIPRAZOLE 10 MG PO TABS: 15.0000 mg | ORAL_TABLET | Freq: Every day | ORAL | Status: DC

## 2017-03-06 MED ORDER — OXYBUTYNIN CHLORIDE 5 MG PO TABS
5.0000 mg | ORAL_TABLET | Freq: Every day | ORAL | Status: DC
  Filled 2017-03-06: qty 1

## 2017-03-06 MED ORDER — ALBUTEROL SULFATE (2.5 MG/3ML) 0.083% IN NEBU
2.5000 mg | INHALATION_SOLUTION | Freq: Four times a day (QID) | RESPIRATORY_TRACT | Status: DC
  Administered 2017-03-06: 2.5 mg via RESPIRATORY_TRACT
  Filled 2017-03-06: qty 3

## 2017-03-06 MED ORDER — IOPAMIDOL (ISOVUE-370) INJECTION 76%
INTRAVENOUS | Status: AC
  Filled 2017-03-06: qty 100

## 2017-03-06 MED ORDER — CLOMIPRAMINE HCL 25 MG PO CAPS
100.0000 mg | ORAL_CAPSULE | Freq: Every day | ORAL | Status: DC
  Filled 2017-03-06: qty 4

## 2017-03-06 MED ORDER — METOPROLOL SUCCINATE ER 100 MG PO TB24
100.0000 mg | ORAL_TABLET | Freq: Every day | ORAL | Status: DC
  Filled 2017-03-06: qty 1

## 2017-03-06 NOTE — ED Notes (Signed)
Pt restless, up ambulating in hallway without assistance. Stats 96% of RA

## 2017-03-06 NOTE — Progress Notes (Signed)
Pt was suctioned prior to trach change. Noted no secretions or blood.

## 2017-03-06 NOTE — Discharge Summary (Signed)
Physician Discharge Summary  Patient ID: Theodore Knox MRN: [id number] DOB/AGE: 46-Apr-1972 46 y.o.  Admit date: 03/05/2017 Discharge date: 03/06/2017  Admission Diagnoses:  Principal Problem:   Cough with hemoptysis Active Problems:   Tracheostomy complication (HCC)   Hemoptysis   Discharge Diagnoses:  Same  Surgeries:  on    Consultants: None  Discharged Condition: Improved  Hospital Course: Theodore Knox is an 46 y.o. male who was admitted 03/05/2017 with a diagnosis of Principal Problem:   Cough with hemoptysis Active Problems:   Tracheostomy complication [facility])   Hemoptysis Patient admitted to [facility] for monitoring overnight with acute hemoptysis.  Patient's airway stable with his current tracheostomy tube in place.  Plan discharge to home.  Plan follow-up with Dr. Pollyann Kennedy in several days for reevaluation as an outpatient.  Recent vital signs:  Vitals:   03/06/17 0315 03/06/17 0630  BP: 118/76 114/76  Pulse: (!) 59 62  Resp:    Temp:    SpO2: 93% 93%    Recent laboratory studies:  Results for orders placed or performed during the hospital encounter of 03/05/17  CBC with Differential  Result Value Ref Range   WBC 10.5 4.0 - 10.5 K/uL   RBC 5.36 4.22 - 5.81 MIL/uL   Hemoglobin 16.0 13.0 - 17.0 g/dL   HCT 40.9 81.1 - 91.4 %   MCV 87.1 78.0 - 100.0 fL   MCH 29.9 26.0 - 34.0 pg   MCHC 34.3 30.0 - 36.0 g/dL   RDW 78.2 95.6 - 21.3 %   Platelets 229 150 - 400 K/uL   Neutrophils Relative % 57 %   Neutro Abs 5.9 1.7 - 7.7 K/uL   Lymphocytes Relative 35 %   Lymphs Abs 3.7 0.7 - 4.0 K/uL   Monocytes Relative 6 %   Monocytes Absolute 0.7 0.1 - 1.0 K/uL   Eosinophils Relative 2 %   Eosinophils Absolute 0.2 0.0 - 0.7 K/uL   Basophils Relative 0 %   Basophils Absolute 0.0 0.0 - 0.1 K/uL  Basic metabolic panel  Result Value Ref Range   Sodium 138 135 - 145 mmol/L   Potassium 4.2 3.5 - 5.1 mmol/L   Chloride 105 101 - 111 mmol/L   CO2 25 22 - 32 mmol/L    Glucose, Bld 99 65 - 99 mg/dL   BUN 14 6 - 20 mg/dL   Creatinine, Ser 0.86 0.61 - 1.24 mg/dL   Calcium 9.3 8.9 - 57.8 mg/dL   GFR calc non Af Amer >60 >60 mL/min   GFR calc Af Amer >60 >60 mL/min   Anion gap 8 5 - 15  Protime-INR  Result Value Ref Range   Prothrombin Time 13.9 11.4 - 15.2 seconds   INR 1.08     Discharge Medications:   Allergies as of 03/06/2017   No Known Allergies     Medication List    TAKE these medications   acetaminophen 500 MG tablet Commonly known as:  TYLENOL Take 1,000 mg by mouth 2 (two) times daily as needed (for pain, headaches, or fever).   albuterol (2.5 MG/3ML) 0.083% nebulizer solution Commonly known as:  PROVENTIL Take 3 mLs (2.5 mg total) by nebulization every 6 (six) hours.   ARIPiprazole 15 MG tablet Commonly known as:  ABILIFY Take 15 mg by mouth daily.   atorvastatin 20 MG tablet Commonly known as:  LIPITOR Take 40 mg by mouth daily.   clomiPRAMINE 50 MG capsule Commonly known as:  ANAFRANIL Take 100 mg by mouth  daily.   diphenhydrAMINE 25 mg capsule Commonly known as:  BENADRYL Take 25 mg by mouth at bedtime as needed for sleep.   Fish Oil 1000 MG Caps Take 1,000 mg by mouth daily.   lithium carbonate 300 MG capsule Take 300 mg by mouth every morning.   Melatonin 3 MG Tabs Take 6 mg by mouth at bedtime.   metoprolol succinate 100 MG 24 hr tablet Commonly known as:  TOPROL-XL Take 100 mg by mouth daily. Take with or immediately following a meal.   Nebulizer Devi Use as directed  Please supply tubing and mask or T piece   OLANZapine 5 MG tablet Commonly known as:  ZYPREXA Take 5 mg by mouth 2 (two) times daily.   oxybutynin 5 MG tablet Commonly known as:  DITROPAN Take 5 mg by mouth daily.   WELLNESS PROTEIN SHAKE PO Take 237 Containers by mouth 2 (two) times daily.       Diagnostic Studies: Dg Chest 2 View  Result Date: 03/05/2017 CLINICAL DATA:  Blood within inner tracheostomy cannula. EXAM: CHEST   2 VIEW COMPARISON:  Chest radiograph 12/31/2016 FINDINGS: Tracheostomy tube is in unchanged position. Bibasilar atelectasis without focal consolidation. Normal cardiomediastinal contours. No pleural effusion or pneumothorax. No pulmonary edema. IMPRESSION: Unchanged appearance tracheostomy tube compared to 12/31/2016. No focal airspace disease. Electronically Signed   By: Deatra RobinsonKevin  Herman M.D.   On: 03/05/2017 22:05   Ct Angio Neck W And/or Wo Contrast  Result Date: 03/06/2017 CLINICAL DATA:  Acute bleeding at tracheostomy site. Status history of subglottic stenosis and stridor. EXAM: CT ANGIOGRAPHY NECK TECHNIQUE: Multidetector CT imaging of the neck was performed using the standard protocol during bolus administration of intravenous contrast. Multiplanar CT image reconstructions and MIPs were obtained to evaluate the vascular anatomy. Carotid stenosis measurements (when applicable) are obtained utilizing NASCET criteria, using the distal internal carotid diameter as the denominator. CONTRAST:  50 cc Isovue 370 COMPARISON:  None. FINDINGS: AORTIC ARCH: Normal appearance of the thoracic arch, normal branch pattern. The origins of the innominate, left Common carotid artery and subclavian artery are widely patent. RIGHT CAROTID SYSTEM: Common carotid artery is widely patent, coursing in a straight line fashion. Normal appearance of the carotid bifurcation without hemodynamically significant stenosis by NASCET criteria. Normal appearance of the included internal carotid artery, tonsillar loop. LEFT CAROTID SYSTEM: Common carotid artery is widely patent, coursing in a straight line fashion. Normal appearance of the carotid bifurcation without hemodynamically significant stenosis by NASCET criteria, trace calcific atherosclerosis. Normal appearance of the included internal carotid artery, tonsillar loop. VERTEBRAL ARTERIES:RIGHT vertebral artery is dominant. Normal appearance of the vertebral arteries, which appear  widely patent. SKELETON: No acute osseous process though bone windows have not been submitted. Severe C4-5 through C7-T1 cervical spondylosis. OTHER NECK: Tracheostomy tube above the carina. 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 Small amount of probable hemorrhage superior mediastinum, inferior to the thyroid lobes and contiguous with tracheostomy tube. No drainable fluid collection. Effaced hypopharynx and apposition the Theodore vocal cords. UPPER CHEST: Included lung apices are clear. No superior mediastinal lymphadenopathy. IMPRESSION: 1. No acute vascular process in the neck. No hemodynamically significant stenosis. 2. Tracheostomy tube with small amount of hemorrhage inferiorly, likely venous etiology. No drainable fluid collection. 3. Effaced hypopharynx, apposition of the Theodore vocal cords effacing airway. Electronically Signed   By: Awilda Metroourtnay  Bloomer M.D.   On: 03/06/2017 01:01   Dg Hip Unilat With Pelvis 2-3 Views Right  Result Date: 02/05/2017 CLINICAL DATA:  Right hip pain with standing and ambulation. Symptoms began last night. EXAM: DG HIP (WITH OR WITHOUT PELVIS) 2-3V RIGHT COMPARISON:  None. FINDINGS: There is no evidence of hip fracture or dislocation. There is no evidence of arthropathy or other focal bone abnormality. IMPRESSION: Negative. Electronically Signed   By: Marnee Spring M.D.   On: 02/05/2017 13:36    Disposition: 01-Home or Self Care  Discharge Instructions    Diet - low sodium heart healthy    Complete by:  As directed    Increase activity slowly    Complete by:  As directed          Signed: Darrion Wyszynski 03/06/2017, 11:49 AM

## 2017-03-06 NOTE — ED Notes (Signed)
Called [facility] and left message

## 2017-03-06 NOTE — Care Management Obs Status (Signed)
MEDICARE OBSERVATION STATUS NOTIFICATION   Patient Details  Name: Theodore Knox MRN: [id number] Date of Birth: 01/05/1971   Medicare Observation Status Notification Given:  Yes    Oletta CohnWood, Atharva Mirsky, RN 03/06/2017, 1:33 PM

## 2017-03-06 NOTE — Care Management CC44 (Signed)
Condition Code 44 Documentation Completed  Patient Details  Name: Theodore Knox MRN: [id number] Date of Birth: 10/08/1970   Condition Code 44 given:  Yes Patient signature on Condition Code 44 notice:  Yes Documentation of 2 MD's agreement:  Yes Code 44 added to claim:  Yes    Oletta CohnWood, Priscilla Finklea, RN 03/06/2017, 1:33 PM
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
## 2017-03-06 NOTE — ED Notes (Signed)
Pt ambulated to restroom with NAD, resp e/u.

## 2017-03-06 NOTE — Progress Notes (Signed)
ENT Progress Note: Hospital day #1   Subjective: Patient's airway stable, no further bleeding  Objective: Vital signs in last 24 hours: Temp:  [99.3 F (37.4 C)] 99.3 F (37.4 C) (10/29 2017) Pulse Rate:  [59-95] 62 (10/30 0630) Resp:  [9-24] 14 (10/30 0145) BP: (108-133)/(65-89) 114/76 (10/30 0630) SpO2:  [92 %-96 %] 93 % (10/30 0630) FiO2 (%):  [21 %] 21 % (10/30 0120) Weight:  [95.3 kg (210 lb)] 95.3 kg (210 lb) (10/29 2020) Weight change:     Intake/Output from previous day: No intake/output data recorded. Intake/Output this shift: No intake/output data recorded.  Labs:  Recent Labs  03/05/17 2115  WBC 10.5  HGB 16.0  HCT 46.7  PLT 229    Recent Labs  03/05/17 2115  NA 138  K 4.2  CL 105  CO2 25  GLUCOSE 99  BUN 14  CALCIUM 9.3    Studies/Results: Dg Chest 2 View  Result Date: 03/05/2017 CLINICAL DATA:  Blood within inner tracheostomy cannula. EXAM: CHEST  2 VIEW COMPARISON:  Chest radiograph 12/31/2016 FINDINGS: Tracheostomy tube is in unchanged position. Bibasilar atelectasis without focal consolidation. Normal cardiomediastinal contours. No pleural effusion or pneumothorax. No pulmonary edema. IMPRESSION: Unchanged appearance tracheostomy tube compared to 12/31/2016. No focal airspace disease. Electronically Signed   By: Deatra RobinsonKevin  Herman M.D.   On: 03/05/2017 22:05   Ct Angio Neck W And/or Wo Contrast  Result Date: 03/06/2017 CLINICAL DATA:  Acute bleeding at tracheostomy site. Status history of subglottic stenosis and stridor. EXAM: CT ANGIOGRAPHY NECK TECHNIQUE: Multidetector CT imaging of the neck was performed using the standard protocol during bolus administration of intravenous contrast. Multiplanar CT image reconstructions and MIPs were obtained to evaluate the vascular anatomy. Carotid stenosis measurements (when applicable) are obtained utilizing NASCET criteria, using the distal internal carotid diameter as the denominator. CONTRAST:  50 cc  Isovue 370 COMPARISON:  None. FINDINGS: AORTIC ARCH: Normal appearance of the thoracic arch, normal branch pattern. The origins of the innominate, left Common carotid artery and subclavian artery are widely patent. RIGHT CAROTID SYSTEM: Common carotid artery is widely patent, coursing in a straight line fashion. Normal appearance of the carotid bifurcation without hemodynamically significant stenosis by NASCET criteria. Normal appearance of the included internal carotid artery, tonsillar loop. LEFT CAROTID SYSTEM: Common carotid artery is widely patent, coursing in a straight line fashion. Normal appearance of the carotid bifurcation without hemodynamically significant stenosis by NASCET criteria, trace calcific atherosclerosis. Normal appearance of the included internal carotid artery, tonsillar loop. VERTEBRAL ARTERIES:RIGHT vertebral artery is dominant. Normal appearance of the vertebral arteries, which appear widely patent. SKELETON: No acute osseous process though bone windows have not been submitted. Severe C4-5 through C7-T1 cervical spondylosis. OTHER NECK: Tracheostomy tube above the carina. 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 Small amount of probable hemorrhage superior mediastinum, inferior to the thyroid lobes and contiguous with tracheostomy tube. No drainable fluid collection. Effaced hypopharynx and apposition the true vocal cords. UPPER CHEST: Included lung apices are clear. No superior mediastinal lymphadenopathy. IMPRESSION: 1. No acute vascular process in the neck. No hemodynamically significant stenosis. 2. Tracheostomy tube with small amount of hemorrhage inferiorly, likely venous etiology. No drainable fluid collection. 3. Effaced hypopharynx, apposition of the true vocal cords effacing airway. Electronically Signed   By: Awilda Metroourtnay  Bloomer M.D.   On: 03/06/2017 01:01     PHYSICAL EXAM: #6 Shiley XLT trach in  place, Passy-Muir valve in place Patient's airway stable,  good respiration and voice. No bloody discharge or hemoptysis   Assessment/Plan: The patient was admitted for overnight observation with a history of acute hemoptysis.  He is currently stable, no evidence of further bleeding airway intact.  Plan discharge to home with tracheostomy tube in place, he will continue with his current trach care including changing inner cannula as needed for cleaning and replacement.  He will continue Passy-Muir valve as needed.  Plan follow-up with Dr. Pollyann Kennedy as an outpatient in the next several days for recheck.  Patient will bring his tracheostomy tube with him to that appointment.    Theodore Knox 03/06/2017, 11:44 AM

## 2017-03-06 NOTE — ED Notes (Signed)
Pt ambulatory in hallway-- alert, denies any discomfort, --

## 2017-03-06 NOTE — ED Notes (Signed)
Dr. Annalee GentaShoemaker is here and speaking with pt

## 2017-03-06 NOTE — ED Notes (Signed)
ENT at bedside

## 2017-03-06 NOTE — ED Notes (Signed)
[facility], spoke with Clydie BraunKaren.

## 2017-03-06 NOTE — ED Notes (Signed)
Pt given coke per request. 

## 2017-03-06 NOTE — ED Provider Notes (Signed)
Assumed care from dr cardama If CT imaging negative, will monitor then d/c home Pt had trach change with bloody output, now improved Dr Eudelia Bunchcardama has spoken to dr shoemaker about this patient If no findings on CT scan, can be discharged    Theodore Knox, Theodore Riccardi, MD 03/06/17 [phone]

## 2017-03-06 NOTE — ED Notes (Signed)
Cleaned pt trach, placed a new trach tie.  Gave pt supplies for cleaning and spoke with him about follow up and advised him NOT to remove the outer cannula.  Pt voiced understanding

## 2017-03-06 NOTE — ED Provider Notes (Signed)
Dr Annalee Gentashoemaker now plans to come to the ED   Zadie RhineWickline, Remo Kirschenmann, MD 03/06/17 [phone]

## 2017-03-06 NOTE — Progress Notes (Signed)
Pt trach change per MD order. PA at bedside to assist this RRT. During the procedure of the trach change. Bougie over old trach, once old trach flanged was removed it illicit a productive cough and patient began coughing up fresh red blood. Large blood clots noted, and fresh red blood all over neck and chest area due to coughing. New #6shiley XLT proximal was replaced.  Theodore Knox went back into place w/o complications. CO2 detector revealed positive color change. Theodore Knox is in place and secure with new clean trach ties. MD Cardama aware of the event. Pt is stable at this time.

## 2017-03-06 NOTE — ED Notes (Signed)
Spoke with Dr. Annalee GentaShoemaker- will be in to see pt around 1130am. Will order #6shiley, uncuffed proximal XLT to send home with pt to take to next appt.

## 2017-03-08 ENCOUNTER — Encounter (HOSPITAL_COMMUNITY): Payer: Self-pay

## 2017-03-08 ENCOUNTER — Emergency Department (HOSPITAL_COMMUNITY)
Admission: EM | Admit: 2017-03-08 | Discharge: 2017-03-08 | Disposition: A | Discharge status: Home / Self Care | Payer: Medicare Other | Attending: Emergency Medicine | Admitting: Emergency Medicine

## 2017-03-08 DIAGNOSIS — I10 Essential (primary) hypertension: Secondary | ICD-10-CM | POA: Diagnosis not present

## 2017-03-08 DIAGNOSIS — J9509 Other tracheostomy complication: Secondary | ICD-10-CM | POA: Diagnosis not present

## 2017-03-08 DIAGNOSIS — Z79899 Other long term (current) drug therapy: Secondary | ICD-10-CM | POA: Insufficient documentation

## 2017-03-08 NOTE — ED Provider Notes (Signed)
[facility] Provider Note   CSN: [id number] Arrival date & time: 03/08/17  0451     History   Chief Complaint Chief Complaint  Patient presents with  . blood in trach    HPI Theodore Levyorman Knox is a 46 y.o. male.  The history is provided by the patient.  Patient presents for bleeding from tracheostomy bleeding.  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 He was discharged but tonight noted some blood from trach.   No fever/vomiting/chest pain/shortness of breath His course is improving He reports the bleeding tonight was not as significant as previous admission He is not on anticoagulants No other acute complaints  Past Medical History:  Diagnosis Date  . Anxiety   . Dysphagia   . Hyperlipemia   . Hypertension   . Muscle weakness   . Stridor   . Subglottic stenosis   . TBI (traumatic brain injury) [facility](HCC)    age 46    Patient Active Problem List   Diagnosis Date Noted  . Cough with hemoptysis 03/06/2017  . Tracheostomy complication (HCC) 03/06/2017  . Hemoptysis 03/06/2017    Past Surgical History:  Procedure Laterality Date  . TRACHEAL DILITATION N/A 09/14/2016   Procedure: TRACHEAL DILITATION; UPSIZE TRACH;  Surgeon: Serena Colonelosen, Jefry, MD;  [facility];  Service: ENT;  Laterality: N/A;  . TRACHEOSTOMY         Home Medications    Prior to Admission medications   Medication Sig Start Date End Date Taking? Authorizing Provider  acetaminophen (TYLENOL) 500 MG tablet Take 1,000 mg by mouth 2 (two) times daily as needed (for pain, headaches, or fever).    [provider]  albuterol (PROVENTIL) (2.5 MG/3ML) 0.083% nebulizer solution Take 3 mLs (2.5 mg total) by nebulization every 6 (six) hours. 12/27/16   Bethel BornGekas, Kelly Marie, PA-C  ARIPiprazole (ABILIFY) 15 MG tablet Take 15 mg by mouth daily.    [provider]    atorvastatin (LIPITOR) 20 MG tablet Take 40 mg by mouth daily.     [provider]  clomiPRAMINE (ANAFRANIL) 50 MG capsule Take 100 mg by mouth daily.    [provider]  diphenhydrAMINE (BENADRYL) 25 mg capsule Take 25 mg by mouth at bedtime as needed for sleep.    [provider]  lithium carbonate 300 MG capsule Take 300 mg by mouth every morning.    [provider]  Melatonin 3 MG TABS Take 6 mg by mouth at bedtime.    [provider]  metoprolol succinate (TOPROL-XL) 100 MG 24 hr tablet Take 100 mg by mouth daily. Take with or immediately following a meal.    [provider]  OLANZapine (ZYPREXA) 5 MG tablet Take 5 mg by mouth 2 (two) times daily.    [provider]  Omega-3 Fatty Acids (FISH OIL) 1000 MG CAPS Take 1,000 mg by mouth daily.    [provider]  oxybutynin (DITROPAN) 5 MG tablet Take 5 mg by mouth daily.    [provider]  Respiratory Therapy Supplies (NEBULIZER) DEVI Use as directed  Please supply tubing and mask or T piece 12/27/16   Bethel BornGekas, Kelly Marie, PA-C  WELLNESS PROTEIN SHAKE PO Take 237 Containers by mouth 2 (two) times daily.    [provider]    Family History History reviewed. No pertinent family history.  Social History Social History  Substance Use Topics  . Smoking status: Never Smoker  . Smokeless tobacco: Never Used  . Alcohol use No     Allergies   Patient has no known allergies.   Review of Systems Review of Systems  Constitutional: Negative for fever.  HENT:       Bleeding from trach   Respiratory: Negative for shortness of breath.   Cardiovascular: Negative for chest pain.  All other systems reviewed and are negative.    Physical Exam Updated Vital Signs BP (!) 119/91 (BP Location: Left Arm)   Pulse 75   Temp 98.4 F (36.9 C) (Oral)   Resp 17   SpO2 97%   Physical Exam CONSTITUTIONAL: Well developed/well nourished HEAD:  Normocephalic/atraumatic EYES: EOMI/PERRL ENMT: Mucous membranes moist, trach in place, no abnormalities noted, no bleeding noted around or in trach site.  No stridor NECK: supple no meningeal signs CV: S1/S2 noted, no murmurs/rubs/gallops noted LUNGS: Lungs are clear to auscultation bilaterally, no apparent distress ABDOMEN: soft, nontender  NEURO: Pt is awake/alert/appropriate, moves all extremitiesx4. He is ambulatory SKIN: warm, color normal PSYCH: no abnormalities of mood noted, alert and oriented to situation   ED Treatments / Results  Labs (all labs ordered are listed, but only abnormal results are displayed) Labs Reviewed - No data to display  EKG  EKG Interpretation None       Radiology No results found.  Procedures Procedures (including critical care time)  Medications Ordered in ED Medications - No data to display   Initial Impression / Assessment and Plan / ED Course  I have reviewed the triage vital signs and the nursing notes.   Pt presents for trach bleeding He has recent significant episode that required admission, and he had CT imaging as well tracheoscopy by ENT.  No significant source of bleeding found Currently, pt is well appearing, no active bleed Will suction trach and reassess 6:03 AM Pt stable Respiratory therapy was able to suction without difficulty, no significant bleeding Pt well appearing/no distress, no bleeding at this time Will discharge, advised to call ENT today  Final Clinical Impressions(s) / ED Diagnoses   Final diagnoses:  Other tracheostomy complication [facility])    New Prescriptions New Prescriptions   No medications on file     Zadie Rhine, MD 03/08/17 [phone]

## 2017-03-08 NOTE — ED Notes (Signed)
PT states understanding of care given, follow up care, and medication prescribed. PT ambulated from ED to car with a steady gait. 

## 2017-03-08 NOTE — ED Triage Notes (Signed)
Pt BIB [facility] with c/o blood in the lower part of the trach. Pt was admitted two days ago for the same problem. Unsure of the source of the blood. Pt is axox4 without signs of any distress noted at this time.

## 2017-03-08 NOTE — Discharge Instructions (Signed)
You can followup with Trach clinic in respiratory care clinic [phone]

## 2017-03-08 NOTE — ED Notes (Signed)
[facility] called for transport.  

## 2017-04-26 ENCOUNTER — Emergency Department (HOSPITAL_COMMUNITY)
Admission: EM | Admit: 2017-04-26 | Discharge: 2017-04-26 | Disposition: A | Discharge status: Home / Self Care | Payer: Medicare Other | Attending: Emergency Medicine | Admitting: Emergency Medicine

## 2017-04-26 ENCOUNTER — Other Ambulatory Visit: Payer: Self-pay

## 2017-04-26 DIAGNOSIS — Z430 Encounter for attention to tracheostomy: Secondary | ICD-10-CM | POA: Diagnosis present

## 2017-04-26 DIAGNOSIS — I10 Essential (primary) hypertension: Secondary | ICD-10-CM | POA: Insufficient documentation

## 2017-04-26 DIAGNOSIS — Z79899 Other long term (current) drug therapy: Secondary | ICD-10-CM | POA: Diagnosis not present

## 2017-04-26 DIAGNOSIS — J9500 Unspecified tracheostomy complication: Secondary | ICD-10-CM

## 2017-04-26 NOTE — ED Notes (Signed)
Bed: [id number] Expected date:  Expected time:  Means of arrival:  Comments: EMS-tube replaced

## 2017-04-26 NOTE — ED Provider Notes (Signed)
[facility]-EMERGENCY DEPT Provider Note   CSN: [id number] Arrival date & time: 04/26/17  [id number]     History   Chief Complaint Chief Complaint  Patient presents with  . Tracheostomy Tube Change    HPI Theodore Knox is a 46 y.o. male with past medical history of TBI, subglottic stenosis who [facility] for tracheostomy tube change.  Patient wears a vent at night.  He reported he did not want to wear the vent last night. He went to change the inner cannula trach this morning and was unable to get it in.  Patient recently seen by otolaryngologist 12/4 and had tracheostomy tube change at that time. He is protecting airway in NAD currently.  Patient denies any fever, chills, cough, chest pain, shortness of breath, drainage or redness surrounding the tracheostomy tube.  HPI  Past Medical History:  Diagnosis Date  . Anxiety   . Dysphagia   . Hyperlipemia   . Hypertension   . Muscle weakness   . Stridor   . Subglottic stenosis   . TBI (traumatic brain injury) [facility](HCC)    age 89    Patient Active Problem List   Diagnosis Date Noted  . Cough with hemoptysis 03/06/2017  . Tracheostomy complication (HCC) 03/06/2017  . Hemoptysis 03/06/2017    Past Surgical History:  Procedure Laterality Date  . TRACHEAL DILITATION N/A 09/14/2016   Procedure: TRACHEAL DILITATION; UPSIZE TRACH;  Surgeon: Serena Colonelosen, Jefry, MD;  [facility];  Service: ENT;  Laterality: N/A;  . TRACHEOSTOMY         Home Medications    Prior to Admission medications   Medication Sig Start Date End Date Taking? Authorizing Provider  acetaminophen (TYLENOL) 500 MG tablet Take 1,000 mg by mouth 2 (two) times daily as needed (for pain, headaches, or fever).    [provider]  albuterol (PROVENTIL) (2.5 MG/3ML) 0.083% nebulizer solution Take 3 mLs (2.5 mg total) by nebulization every 6 (six) hours. 12/27/16   Bethel BornGekas, Kelly Marie, PA-C    ARIPiprazole (ABILIFY) 15 MG tablet Take 15 mg by mouth daily.    [provider]  atorvastatin (LIPITOR) 20 MG tablet Take 40 mg by mouth daily.     [provider]  clomiPRAMINE (ANAFRANIL) 50 MG capsule Take 100 mg by mouth daily.    [provider]  diphenhydrAMINE (BENADRYL) 25 mg capsule Take 25 mg by mouth at bedtime as needed for sleep.    [provider]  lithium carbonate 300 MG capsule Take 300 mg by mouth every morning.    [provider]  Melatonin 3 MG TABS Take 6 mg by mouth at bedtime.    [provider]  metoprolol succinate (TOPROL-XL) 100 MG 24 hr tablet Take 100 mg by mouth daily. Take with or immediately following a meal.    [provider]  OLANZapine (ZYPREXA) 5 MG tablet Take 5 mg by mouth 2 (two) times daily.    [provider]  Omega-3 Fatty Acids (FISH OIL) 1000 MG CAPS Take 1,000 mg by mouth daily.    [provider]  oxybutynin (DITROPAN) 5 MG tablet Take 5 mg by mouth daily.    [provider]  Respiratory Therapy Supplies (NEBULIZER) DEVI Use as directed  Please supply tubing and mask or T piece 12/27/16   Bethel BornGekas, Kelly Marie, PA-C  WELLNESS PROTEIN SHAKE PO Take 237 Containers by mouth 2 (two) times daily.  [provider]    Family History No family history on file.  Social History Social History   Tobacco Use  . Smoking status: Never Smoker  . Smokeless tobacco: Never Used  Substance Use Topics  . Alcohol use: No  . Drug use: No     Allergies   Patient has no known allergies.   Review of Systems Review of Systems  Constitutional: Negative for chills and fever.  HENT: Negative for sore throat.   Respiratory: Negative for cough and shortness of breath.        Tracheostomy tube problems  Cardiovascular: Negative for chest pain.  Skin: Negative for color change and wound.     Physical Exam Updated Vital Signs Pulse 82   Temp 98 F (36.7  C) (Oral)   Resp 20   Ht 5\' 7"  (1.702 m)   Wt 90.7 kg (200 lb)   SpO2 96%   BMI 31.32 kg/m   Physical Exam  Constitutional: He appears well-developed and well-nourished.  HENT:  Head: Normocephalic and atraumatic.  Right Ear: External ear normal.  Left Ear: External ear normal.  Nose: Nose normal.  Mouth/Throat: Uvula is midline, oropharynx is clear and moist and mucous membranes are normal. No tonsillar exudate.  Eyes: Pupils are equal, round, and reactive to light. Right eye exhibits no discharge. Left eye exhibits no discharge. No scleral icterus.  Neck: Trachea normal and normal range of motion. Neck supple. No spinous process tenderness present. No neck rigidity. Normal range of motion present.  Tracheostomy tube present without surrounding erythema or discharge.  Cardiovascular: Normal rate, regular rhythm and intact distal pulses.  No murmur heard. Pulses:      Radial pulses are 2+ on the right side, and 2+ on the left side.       Dorsalis pedis pulses are 2+ on the right side, and 2+ on the left side.       Posterior tibial pulses are 2+ on the right side, and 2+ on the left side.  No lower extremity swelling or edema. Calves symmetric in size bilaterally.  Pulmonary/Chest: Effort normal and breath sounds normal. He exhibits no tenderness.  Abdominal: Soft. Bowel sounds are normal. There is no tenderness. There is no rebound and no guarding.  Musculoskeletal: He exhibits no edema.  Lymphadenopathy:    He has no cervical adenopathy.  Neurological: He is alert.  Awake and alert  Skin: Skin is warm and dry. No rash noted. He is not diaphoretic.  Psychiatric: He has a normal mood and affect.  Nursing note and vitals reviewed.    ED Treatments / Results  Labs (all labs ordered are listed, but only abnormal results are displayed) Labs Reviewed - No data to display  EKG  EKG Interpretation None       Radiology No results found.  Procedures Procedures  (including critical care time)  Medications Ordered in ED Medications - No data to display   Initial Impression / Assessment and Plan / ED Course  I have reviewed the triage vital signs and the nursing notes.  Pertinent labs & imaging results that were available during my care of the patient were reviewed by me and considered in my medical decision making (see chart for details).     46 y.o. male presenting for change of tracheostomy inner cannula.  Vital signs are reassuring.  He is nontoxic on presentation.  He is protecting airway and breathing without difficulty.  Respiratory therapy saw the patient and change the  inner cannula.  No complications noted.  The patient is otherwise symptomatic.  There is no signs of infection around the tracheostomy tube.  Will discharge the patient back to his facility and have him follow-up with his otolaryngologist vs PCP in regards to today's visit.  Specific return precautions discussed.  Patient verbalizes understanding is in agreement with plan.  Final Clinical Impressions(s) / ED Diagnoses   Final diagnoses:  Complication of tracheostomy tube [facility](HCC)    ED Discharge Orders    None       Princella PellegriniMaczis, Michael M, PA-C 04/26/17 [id number]    Cathren LaineSteinl, [name], MD 04/27/17 [phone]

## 2017-04-26 NOTE — Progress Notes (Signed)
CSW spoke with PA and was informed that at this time [facility] CSW spoke with pt via phone and offered other placement for pt but explained that this placement could be out of state considering that pt has a trach and is using vent support at night. After this information was presented to pt, pt  informed CSW that pt is wanting to return to the facility at this time and speak with Ms. Jerrye BeaversHazel [facility]. At this time there are no further CSW needs. CSW signing off.     Theodore MangesKierra S. Rainn Knox, MSW, LCSW-A Emergency Department Clinical Social Worker [phone]

## 2017-04-26 NOTE — ED Triage Notes (Addendum)
[facility]. Pt wear vent at night. Pt did not wear vent last night d/t "not wanting to run out of oxygen". Pt went to change inner cannula of trach, and was unable to get it in. Pt is currently maintaining own air way with tach collar. Per EMS personnel, pt had suction canister and younker housed in bathroom. Younker was on bathroom floor uncovered and canister had not been emptied/changed in a while. It was determined whether secretions or mildew had formed in canister. There is only a med tech present at this facility which is concerning for pt needs.

## 2017-04-26 NOTE — Clinical Social Work Note (Signed)
Clinical Social Work Assessment  Patient Details  Name: Theodore Knox MRN: [id number] Date of Birth: 14-Nov-1970  Date of referral:  04/19/17               Reason for consult:  Facility Placement                Permission sought to share information with:    Permission granted to share information::     Name::        Agency::     Relationship::     Contact Information:     Housing/Transportation Living arrangements for the past 2 months:  Assisted Living Facility([facility] ) Source of Information:  Patient Patient Interpreter Needed:  None Criminal Activity/Legal Involvement Pertinent to Current Situation/Hospitalization:  No - Comment as needed Significant Relationships:  Warehouse manager Lives with:  Self, Facility Resident Do you feel safe going back to the place where you live?  Yes Need for family participation in patient care:  Yes (Comment)  Care giving concerns:  CSW spoke with pt via phone. CSW was informed that at this time pt's only concerns is living conditions.    Social Worker assessment / plan:  CSW spoke with pt via phone. During this time CSW was informed that pt is from [name] and has been there since July of 2018. Pt reports that pt likes living at this facility but sometimes feels that his environment could be a little cleaner being that pt has a medical condition that requires sanitation. CSW presented other options of placement to pt and pt declined as pt reports that pt is okay with staying there pt just wishes that he had help in cleaning. CSW informed pt that if pt feels that needs are no being met or that pt is in danger then pt should reach out administration staff at the facility and see what can be done. CSW also notified pt that if that was not helping then pt could reach to higher sources such as the state for help.  At this time pt is wanting to go back to the facility and talk to Ms. Hazel about pt's concerns.   Employment status:  Other  (Comment)(unknown. ) Insurance information:  Medicare PT Recommendations:  Not assessed at this time Information / Referral to community resources:     Patient/Family's Response to care:  Pt is agreeable and understanding to plan of care at this time.   Patient/Family's Understanding of and Emotional Response to Diagnosis, Current Treatment, and Prognosis:  No further questions or concerns have been presented to CSW at this time.   Emotional Assessment Appearance:  Appears stated age Attitude/Demeanor/Rapport:    Affect (typically observed):  Accepting, Appropriate, Pleasant Orientation:  Oriented to Situation, Oriented to Self, Oriented to Place, Oriented to  Time Alcohol / Substance use:  Not Applicable Psych involvement (Current and /or in the community):  No (Comment)  Discharge Needs  Concerns to be addressed:  Basic Needs, Care Coordination, Home Safety Concerns Readmission within the last 30 days:  No Current discharge risk:  None Barriers to Discharge:  No Barriers Identified   Wetzel Bjornstad, [place] 04/26/2017, 10:13 AM

## 2017-04-26 NOTE — ED Notes (Signed)
[facility] called for transport back to facility 

## 2017-04-26 NOTE — Discharge Instructions (Signed)
You were seen here today for change of your inner canula of your tracheostomy tube. If you develop worsening or new concerning symptoms you can return to the emergency department for re-evaluation.

## 2017-05-07 ENCOUNTER — Encounter (HOSPITAL_COMMUNITY): Payer: Self-pay

## 2017-05-07 ENCOUNTER — Emergency Department (HOSPITAL_COMMUNITY)
Admission: EM | Admit: 2017-05-07 | Discharge: 2017-05-08 | Disposition: A | Discharge status: Home / Self Care | Payer: Medicare Other | Attending: Emergency Medicine | Admitting: Emergency Medicine

## 2017-05-07 DIAGNOSIS — G4730 Sleep apnea, unspecified: Secondary | ICD-10-CM | POA: Diagnosis not present

## 2017-05-07 DIAGNOSIS — I10 Essential (primary) hypertension: Secondary | ICD-10-CM | POA: Diagnosis not present

## 2017-05-07 DIAGNOSIS — Z79899 Other long term (current) drug therapy: Secondary | ICD-10-CM | POA: Diagnosis not present

## 2017-05-07 DIAGNOSIS — Z430 Encounter for attention to tracheostomy: Secondary | ICD-10-CM | POA: Diagnosis present

## 2017-05-07 NOTE — ED Notes (Signed)
Bed: [id number] Expected date:  Expected time:  Means of arrival:  Comments: EMS 46 yo male trach issues

## 2017-05-07 NOTE — ED Triage Notes (Signed)
[facility] with pt stating he is missing piece of trach tubing. No complaints of pain or SOB. States he came to replace his piece and does not need any other medical attention.  140/82 [phone] RA

## 2017-05-08 NOTE — ED Notes (Signed)
[facility] called for transport.  

## 2017-05-08 NOTE — Discharge Instructions (Signed)
[facility] [phone])  [facility] [phone])

## 2017-05-08 NOTE — ED Notes (Signed)
[facility] aware of discharge

## 2017-05-08 NOTE — ED Provider Notes (Signed)
[facility] Provider Note   CSN: [id number] Arrival date & time: 05/07/17  2323     History   Chief Complaint Chief Complaint  Patient presents with  . Medication Refill    HPI Xane Amsden is a 47 y.o. male.  HPI Patient is a 47 year old male with a history of subglottic stenosis and sleep apnea.  He wears trach collar only at night.  He otherwise is on room air.  He presents the emergency department as he accidentally threw away the T coupler which connects his home oxygen condenser to his trach collar.  He is requesting if we have one to replace this.  He states he contacted his home health agency who is in [facility] and they are unable to send him one.   Past Medical History:  Diagnosis Date  . Anxiety   . Dysphagia   . Hyperlipemia   . Hypertension   . Muscle weakness   . Stridor   . Subglottic stenosis   . TBI (traumatic brain injury) [facility])    age 42    Patient Active Problem List   Diagnosis Date Noted  . Cough with hemoptysis 03/06/2017  . Tracheostomy complication (HCC) 03/06/2017  . Hemoptysis 03/06/2017    Past Surgical History:  Procedure Laterality Date  . TRACHEAL DILITATION N/A 09/14/2016   Procedure: TRACHEAL DILITATION; UPSIZE TRACH;  Surgeon: Serena Colonel, MD;  Location: [facility];  Service: ENT;  Laterality: N/A;  . TRACHEOSTOMY         Home Medications    Prior to Admission medications   Medication Sig Start Date End Date Taking? Authorizing Provider  acetaminophen (TYLENOL) 500 MG tablet Take 1,000 mg by mouth 2 (two) times daily as needed (for pain, headaches, or fever).    [provider]  albuterol (PROVENTIL) (2.5 MG/3ML) 0.083% nebulizer solution Take 3 mLs (2.5 mg total) by nebulization every 6 (six) hours. 12/27/16   Bethel Born, PA-C  ARIPiprazole (ABILIFY) 15 MG tablet Take 15 mg by mouth daily.    [provider]  atorvastatin (LIPITOR) 20 MG tablet Take 40  mg by mouth daily.     [provider]  clomiPRAMINE (ANAFRANIL) 50 MG capsule Take 100 mg by mouth daily.    [provider]  diphenhydrAMINE (BENADRYL) 25 mg capsule Take 25 mg by mouth at bedtime as needed for sleep.    [provider]  lithium carbonate 300 MG capsule Take 300 mg by mouth every morning.    [provider]  Melatonin 3 MG TABS Take 6 mg by mouth at bedtime.    [provider]  metoprolol succinate (TOPROL-XL) 100 MG 24 hr tablet Take 100 mg by mouth daily. Take with or immediately following a meal.    [provider]  OLANZapine (ZYPREXA) 5 MG tablet Take 5 mg by mouth 2 (two) times daily.    [provider]  Omega-3 Fatty Acids (FISH OIL) 1000 MG CAPS Take 1,000 mg by mouth daily.    [provider]  oxybutynin (DITROPAN) 5 MG tablet Take 5 mg by mouth daily.    [provider]  Respiratory Therapy Supplies (NEBULIZER) DEVI Use as directed  Please supply tubing and mask or T piece 12/27/16   Bethel Born, PA-C  WELLNESS PROTEIN SHAKE PO Take 237 Containers by mouth 2 (two) times daily.    [provider]    Family History History reviewed. No pertinent family history.  Social History Social History   Tobacco Use  . Smoking status: Never Smoker  . Smokeless tobacco: Never Used  Substance Use Topics  . Alcohol use: No  . Drug use: No     Allergies   Patient has no known allergies.   Review of Systems Review of Systems  All other systems reviewed and are negative.    Physical Exam Updated Vital Signs Ht 5\' 7"  (1.702 m)   Wt 90.7 kg (200 lb)   SpO2 94%   BMI 31.32 kg/m   Physical Exam  Constitutional: He is oriented to person, place, and time. He appears well-developed and well-nourished.  HENT:  Head: Normocephalic and atraumatic.  Eyes: EOM are normal.  Neck: Normal range of motion.  Trach in place  Pulmonary/Chest: Effort normal. No respiratory  distress.  Abdominal: Soft. He exhibits no distension. There is no tenderness.  Musculoskeletal: Normal range of motion.  Neurological: He is alert and oriented to person, place, and time.  Skin: Skin is warm and dry.  Psychiatric: He has a normal mood and affect. Judgment normal.  Nursing note and vitals reviewed.    ED Treatments / Results  Labs (all labs ordered are listed, but only abnormal results are displayed) Labs Reviewed - No data to display  EKG  EKG Interpretation None       Radiology No results found.  Procedures Procedures (including critical care time)  Medications Ordered in ED Medications - No data to display   Initial Impression / Assessment and Plan / ED Course  I have reviewed the triage vital signs and the nursing notes.  Pertinent labs & imaging results that were available during my care of the patient were reviewed by me and considered in my medical decision making (see chart for details).     Patient given a list of home health agencies which he can contact for assistance with his mechanical complications at home.  No indication for additional workup at this time.  Medical screening examination complete.  Final Clinical Impressions(s) / ED Diagnoses   Final diagnoses:  Sleep apnea in adult    ED Discharge Orders    None       Azalia Bilisampos, Soni Kegel, MD 05/08/17 [phone]

## 2017-05-24 ENCOUNTER — Emergency Department (HOSPITAL_COMMUNITY)
Admission: EM | Admit: 2017-05-24 | Discharge: 2017-05-24 | Disposition: A | Discharge status: Home / Self Care | Payer: Medicare Other | Attending: Emergency Medicine | Admitting: Emergency Medicine

## 2017-05-24 ENCOUNTER — Encounter (HOSPITAL_COMMUNITY): Payer: Self-pay | Admitting: Emergency Medicine

## 2017-05-24 DIAGNOSIS — Z79899 Other long term (current) drug therapy: Secondary | ICD-10-CM | POA: Diagnosis not present

## 2017-05-24 DIAGNOSIS — J9503 Malfunction of tracheostomy stoma: Secondary | ICD-10-CM | POA: Diagnosis present

## 2017-05-24 DIAGNOSIS — I10 Essential (primary) hypertension: Secondary | ICD-10-CM | POA: Diagnosis not present

## 2017-05-24 NOTE — ED Triage Notes (Signed)
Patient here from [facility] via EMS with complaints of trach problem. Patient reports that he does not know what is wring with trach.

## 2017-05-24 NOTE — ED Provider Notes (Signed)
[facility]-EMER[facility]PT Provider Note   CSN: [id number] Arrival date & time: 05/24/17  [id number]     History   Chief Complaint Chief Complaint  Patient presents with  . Tracheostomy Tube Change    HPI Theodore Knox is a 47 y.o. male.  HPI Patient presents due to complication of his tracheostomy tube. The patient has had a tube since he was a young male. He states that he is otherwise well, denies cough, fever, dyspnea. Today, he was unable to place the anterior cannula of his device. Presents here for evaluation.  Past Medical History:  Diagnosis Date  . Anxiety   . Dysphagia   . Hyperlipemia   . Hypertension   . Muscle weakness   . Stridor   . Subglottic stenosis   . TBI (traumatic brain injury) [facility](HCC)    age 47    Patient Active Problem List   Diagnosis Date Noted  . Cough with hemoptysis 03/06/2017  . Tracheostomy complication (HCC) 03/06/2017  . Hemoptysis 03/06/2017    Past Surgical History:  Procedure Laterality Date  . TRACHEAL DILITATION N/A 09/14/2016   Procedure: TRACHEAL DILITATION; UPSIZE TRACH;  Surgeon: Serena Colonelosen, Jefry, MD;  [facility];  Service: ENT;  Laterality: N/A;  . TRACHEOSTOMY         Home Medications    Prior to Admission medications   Medication Sig Start Date End Date Taking? Authorizing Provider  acetaminophen (TYLENOL) 500 MG tablet Take 1,000 mg by mouth 2 (two) times daily as needed (for pain, headaches, or fever).    [provider]  albuterol (PROVENTIL) (2.5 MG/3ML) 0.083% nebulizer solution Take 3 mLs (2.5 mg total) by nebulization every 6 (six) hours. 12/27/16   Bethel Knox, Theodore Marie, PA-C  ARIPiprazole (ABILIFY) 15 MG tablet Take 15 mg by mouth daily.    [provider]  atorvastatin (LIPITOR) 20 MG tablet Take 40 mg by mouth daily.     [provider]  clomiPRAMINE (ANAFRANIL) 50 MG capsule Take 100 mg by mouth daily.    [provider]  diphenhydrAMINE (BENADRYL) 25  mg capsule Take 25 mg by mouth at bedtime as needed for sleep.    [provider]  lithium carbonate 300 MG capsule Take 300 mg by mouth every morning.    [provider]  Melatonin 3 MG TABS Take 6 mg by mouth at bedtime.    [provider]  metoprolol succinate (TOPROL-XL) 100 MG 24 hr tablet Take 100 mg by mouth daily. Take with or immediately following a meal.    [provider]  OLANZapine (ZYPREXA) 5 MG tablet Take 5 mg by mouth 2 (two) times daily.    [provider]  Omega-3 Fatty Acids (FISH OIL) 1000 MG CAPS Take 1,000 mg by mouth daily.    [provider]  oxybutynin (DITROPAN) 5 MG tablet Take 5 mg by mouth daily.    [provider]  Respiratory Therapy Supplies (NEBULIZER) DEVI Use as directed  Please supply tubing and mask or T piece 12/27/16   Bethel Knox, Theodore Marie, PA-C  WELLNESS PROTEIN SHAKE PO Take 237 Containers by mouth 2 (two) times daily.    [provider]    Family History No family history on file.  Social History Social History   Tobacco Use  . Smoking status: Never Smoker  . Smokeless tobacco: Never Used  Substance Use Topics  . Alcohol use: No  . Drug use: No     Allergies  Patient has no known allergies.   Review of Systems Review of Systems  Constitutional: Negative for fever.  Respiratory: Negative for shortness of breath.   Cardiovascular: Negative for chest pain.  Musculoskeletal:       Negative aside from HPI  Skin:       Negative aside from HPI  Allergic/Immunologic: Negative for immunocompromised state.  Neurological: Negative for weakness.     Physical Exam Updated Vital Signs BP 120/87 (BP Location: Right Arm)   Pulse 72   Temp 98.3 F (36.8 C) (Oral)   Resp 13   SpO2 94%   Physical Exam  Constitutional: He is oriented to person, place, and time. He appears well-developed. No distress.  HENT:  Head: Normocephalic and atraumatic.  Eyes: Conjunctivae and  EOM are normal.  Neck:    Cardiovascular: Normal rate and regular rhythm.  Pulmonary/Chest: Effort normal. No stridor. No respiratory distress.  Abdominal: He exhibits no distension.  Musculoskeletal: He exhibits no edema.  Neurological: He is alert and oriented to person, place, and time.  Skin: Skin is warm and dry.  Psychiatric: He has a normal mood and affect.  Nursing note and vitals reviewed.    ED Treatments / Results   Procedures TRACHEOSTOMY REPLACEMENT Date/Time: 05/24/2017 9:00 AM Performed by: Gerhard Munch, MD Authorized by: Gerhard Munch, MD  Consent: The procedure was performed in an emergent situation. Verbal consent obtained. Risks and benefits: risks, benefits and alternatives were discussed Consent given by: patient Patient understanding: patient states understanding of the procedure being performed Patient consent: the patient's understanding of the procedure matches consent given Procedure consent: procedure consent matches procedure scheduled Required items: required blood products, implants, devices, and special equipment available Patient identity confirmed: verbally with patient Time out: Immediately prior to procedure a "time out" was called to verify the correct patient, procedure, equipment, support staff and site/side marked as required. Indications: became dislodged Local anesthesia used: no  Anesthesia: Local anesthesia used: no  Sedation: Patient sedated: no  Preparation: Patient was prepped and draped in the usual sterile fashion. Tube type: double cannula Tube cuff: cuffless Tube size: 6.0 mm Cuff type: tight to shaft Patient tolerance: Patient tolerated the procedure well with no immediate complications    (including critical care time)   Initial Impression / Assessment and Plan / ED Course  I have reviewed the triage vital signs and the nursing notes.  Pertinent labs & imaging results that were available during my care of  the patient were reviewed by me and considered in my medical decision making (see chart for details).  This well-appearing male presents due to missing piece of his tracheostomy device. He is awake, alert, afebrile, no dyspnea, either subjective, nor objective. Patient had successful replacement of the anterior cannula, was discharged in stable condition.  Final Clinical Impressions(s) / ED Diagnoses  Location of tracheostomy   Gerhard Munch, MD 05/24/17 [phone]

## 2017-05-24 NOTE — Progress Notes (Signed)
Rt helped MD change trach for pt. Pt got a new XLT Shiley CFS. Pt having no complications with new trach at this time.

## 2017-05-24 NOTE — ED Notes (Signed)
Report [facility] to [facility] regarding pt treatment and d/c. Facility transport is en route to this ED to pick pt up.

## 2017-05-24 NOTE — Discharge Instructions (Signed)
As discussed, your evaluation today has been largely reassuring.  But, it is important that you monitor your condition carefully, and do not hesitate to return to the ED if you develop new, or concerning changes in your condition. ? ?Otherwise, please follow-up with your physician for appropriate ongoing care. ? ?

## 2017-06-24 ENCOUNTER — Other Ambulatory Visit: Payer: Self-pay

## 2017-06-24 ENCOUNTER — Emergency Department (HOSPITAL_COMMUNITY)
Admission: EM | Admit: 2017-06-24 | Discharge: 2017-06-24 | Disposition: A | Discharge status: Home / Self Care | Payer: Medicare Other | Attending: Emergency Medicine | Admitting: Emergency Medicine

## 2017-06-24 ENCOUNTER — Encounter (HOSPITAL_COMMUNITY): Payer: Self-pay

## 2017-06-24 DIAGNOSIS — Z87820 Personal history of traumatic brain injury: Secondary | ICD-10-CM | POA: Insufficient documentation

## 2017-06-24 DIAGNOSIS — R1310 Dysphagia, unspecified: Secondary | ICD-10-CM | POA: Insufficient documentation

## 2017-06-24 DIAGNOSIS — Z430 Encounter for attention to tracheostomy: Secondary | ICD-10-CM | POA: Diagnosis present

## 2017-06-24 NOTE — ED Triage Notes (Signed)
He is ambulatory and healthy-looking. He states he is here for a trach. Tube change (only). He reports no problems with his current trach (which is a long Shiley #6), rather "it is simply time to have it changed". He speaks quite well with his passe-muir valve in place. Plan to have RT replace it in [place].

## 2017-06-24 NOTE — Discharge Instructions (Signed)
Continue your tracheostomy tube care, as previously prescribed.

## 2017-06-24 NOTE — ED Notes (Signed)
Spoke with respiratory about tube change- will call after change. RT states they may not be able to change tube due to a policy stating if anesthesiologist is not present (after 5p) they cannot due tube changes. Will follow up

## 2017-06-24 NOTE — ED Notes (Signed)
RT reports they will not change tube unless patient is having breathing troubles. Pt in no distress. PA Shawn made aware, [facility]

## 2017-06-24 NOTE — ED Provider Notes (Signed)
[facility]PT Provider Note   CSN: [id number] Arrival date & time: 06/24/17  1551     History   Chief Complaint Chief Complaint  Patient presents with  . Tracheostomy Tube Change    HPI [facility] is a 47 y.o. male.  HPI   [facility] is a 47 y.o. male, presenting to the ED with request for tracheostomy tube change.  States that he came to the ED, "because it's time for it to be changed and I thought it would be faster in the ED." States he can typically change the tube himself, he just needs assistance with a collar.  He states he has not had any problems with changing the tube in the past. He brought the replacement tube with him. It is a Shiley 6.0 extra long. Denies shortness of breath, dislodgment, trauma, or any other complaints.    Past Medical History:  Diagnosis Date  . Anxiety   . Dysphagia   . Hyperlipemia   . Hypertension   . Muscle weakness   . Stridor   . Subglottic stenosis   . TBI (traumatic brain injury) [facility])    age 58    Patient Active Problem List   Diagnosis Date Noted  . Cough with hemoptysis 03/06/2017  . Tracheostomy complication (HCC) 03/06/2017  . Hemoptysis 03/06/2017    Past Surgical History:  Procedure Laterality Date  . TRACHEAL DILITATION N/A 09/14/2016   Procedure: TRACHEAL DILITATION; UPSIZE TRACH;  Surgeon: Serena Colonel, MD;  Location: [facility] OR;  Service: ENT;  Laterality: N/A;  . TRACHEOSTOMY         Home Medications    Prior to Admission medications   Medication Sig Start Date End Date Taking? Authorizing Provider  acetaminophen (TYLENOL) 500 MG tablet Take 1,000 mg by mouth 2 (two) times daily as needed (for pain, headaches, or fever).    [provider]  albuterol (PROVENTIL) (2.5 MG/3ML) 0.083% nebulizer solution Take 3 mLs (2.5 mg total) by nebulization every 6 (six) hours. 12/27/16   Bethel Born, PA-C  ARIPiprazole (ABILIFY) 15 MG tablet Take 15 mg by mouth daily.     [provider]  atorvastatin (LIPITOR) 20 MG tablet Take 40 mg by mouth daily.     [provider]  clomiPRAMINE (ANAFRANIL) 50 MG capsule Take 100 mg by mouth daily.    [provider]  diphenhydrAMINE (BENADRYL) 25 mg capsule Take 25 mg by mouth at bedtime as needed for sleep.    [provider]  lithium carbonate 300 MG capsule Take 300 mg by mouth every morning.    [provider]  Melatonin 3 MG TABS Take 6 mg by mouth at bedtime.    [provider]  metoprolol succinate (TOPROL-XL) 100 MG 24 hr tablet Take 100 mg by mouth daily. Take with or immediately following a meal.    [provider]  OLANZapine (ZYPREXA) 5 MG tablet Take 5 mg by mouth 2 (two) times daily.    [provider]  Omega-3 Fatty Acids (FISH OIL) 1000 MG CAPS Take 1,000 mg by mouth daily.    [provider]  oxybutynin (DITROPAN) 5 MG tablet Take 5 mg by mouth daily.    [provider]  Respiratory Therapy Supplies (NEBULIZER) DEVI Use as directed  Please supply tubing and mask or T piece 12/27/16   Bethel Born, PA-C  WELLNESS PROTEIN SHAKE PO Take 237 Containers by mouth 2 (two) times daily.  [provider]    Family History No family history on file.  Social History Social History   Tobacco Use  . Smoking status: Never Smoker  . Smokeless tobacco: Never Used  Substance Use Topics  . Alcohol use: No  . Drug use: No     Allergies   Patient has no known allergies.   Review of Systems Review of Systems  Constitutional: Negative for chills and fever.  Respiratory: Negative for cough and shortness of breath.   Cardiovascular: Negative for chest pain.  Gastrointestinal: Negative for nausea and vomiting.  Musculoskeletal: Negative for neck pain.     Physical Exam Updated Vital Signs BP (!) 143/106 (BP Location: Left Arm)   Pulse 95   Temp 98.8 F (37.1 C) (Oral)   Resp 18   SpO2 97%    Physical Exam  Constitutional: He appears well-developed and well-nourished. No distress.  HENT:  Head: Normocephalic and atraumatic.  Eyes: Conjunctivae are normal.  Neck: Neck supple.  Cardiovascular: Normal rate and regular rhythm.  Pulmonary/Chest: Effort normal and breath sounds normal. No respiratory distress.  Patient has tracheostomy tube in place with no evidence of dislodgment.  He shows no signs of respiratory distress.  Neurological: He is alert.  Skin: Skin is warm and dry. He is not diaphoretic. No pallor.  Psychiatric: He has a normal mood and affect. His behavior is normal.  Nursing note and vitals reviewed.    ED Treatments / Results  Labs (all labs ordered are listed, but only abnormal results are displayed) Labs Reviewed - No data to display  EKG  EKG Interpretation None       Radiology No results found.  Procedures TRACHEOSTOMY REPLACEMENT Date/Time: 06/24/2017 7:44 PM Performed by: Anselm PancoastJoy, Yazaira Speas C, PA-C Authorized by: Anselm PancoastJoy, Lavonda Thal C, PA-C  Consent: Verbal consent obtained. Risks and benefits: risks, benefits and alternatives were discussed Consent given by: patient Patient understanding: patient states understanding of the procedure being performed Patient consent: the patient's understanding of the procedure matches consent given Procedure consent: procedure consent matches procedure scheduled Patient identity confirmed: verbally with patient and provided demographic data Indications: routine Local anesthesia used: no  Anesthesia: Local anesthesia used: no  Sedation: Patient sedated: no  Tube type: single cannula Tube cuff: cuffless Tube size: 6.0 mm Patient tolerance: Patient tolerated the procedure well with no immediate complications    (including critical care time)  Medications Ordered in ED Medications - No data to display   Initial Impression / Assessment and Plan / ED Course  I have reviewed the triage vital signs and the  nursing notes.  Pertinent labs & imaging results that were available during my care of the patient were reviewed by me and considered in my medical decision making (see chart for details).     Patient presents with request for tracheostomy tube change.  This was changed in the ED without immediate complication.  Patient was able to be observed in the ED while waiting for his ride.  There were no signs of distress or respiratory compromise during this time.  Return precautions discussed.  Patient voices understanding of all instructions and is comfortable discharge.  Findings and plan of care discussed with Gwyneth SproutWhitney Plunkett, MD.   Vitals:   06/24/17 1559 06/24/17 1717 06/24/17 2016  BP: (!) 150/99 (!) 143/106 (!) 138/107  Pulse: 85 95 96  Resp: 18 18 16   Temp: 98.8 F (37.1 C)  98.6 F (37 C)  TempSrc: Oral  Oral  SpO2: 98%  97% 93%     Final Clinical Impressions(s) / ED Diagnoses   Final diagnoses:  Encounter for tracheostomy tube change [facility])    ED Discharge Orders    None       Concepcion Living 06/24/17 2043    Gwyneth Sprout, MD 06/24/17 2356

## 2017-06-24 NOTE — ED Notes (Signed)
Pt states he is from [facility] and must be transported back via [facility]. [facility] has been called.

## 2017-07-29 ENCOUNTER — Emergency Department (HOSPITAL_COMMUNITY)
Admission: EM | Admit: 2017-07-29 | Discharge: 2017-07-30 | Disposition: A | Discharge status: Home / Self Care | Payer: Medicare Other | Attending: Emergency Medicine | Admitting: Emergency Medicine

## 2017-07-29 ENCOUNTER — Encounter (HOSPITAL_COMMUNITY): Payer: Self-pay | Admitting: *Deleted

## 2017-07-29 ENCOUNTER — Emergency Department (HOSPITAL_COMMUNITY): Payer: Medicare Other

## 2017-07-29 ENCOUNTER — Other Ambulatory Visit: Payer: Self-pay

## 2017-07-29 DIAGNOSIS — J9500 Unspecified tracheostomy complication: Secondary | ICD-10-CM | POA: Insufficient documentation

## 2017-07-29 DIAGNOSIS — Z87820 Personal history of traumatic brain injury: Secondary | ICD-10-CM | POA: Insufficient documentation

## 2017-07-29 DIAGNOSIS — R0689 Other abnormalities of breathing: Secondary | ICD-10-CM | POA: Diagnosis not present

## 2017-07-29 DIAGNOSIS — Z79899 Other long term (current) drug therapy: Secondary | ICD-10-CM | POA: Diagnosis not present

## 2017-07-29 DIAGNOSIS — I10 Essential (primary) hypertension: Secondary | ICD-10-CM | POA: Insufficient documentation

## 2017-07-29 DIAGNOSIS — Z930 Tracheostomy status: Secondary | ICD-10-CM | POA: Diagnosis not present

## 2017-07-29 NOTE — ED Provider Notes (Signed)
[facility]PT Provider Note   CSN: [id number] Arrival date & time: 07/29/17  2258     History   Chief Complaint No chief complaint on file.   HPI [facility] is a 47 y.o. male.  The history is provided by the patient.  He has history of hypertension, hyperlipidemia, traumatic brain injury, subglottic stenosis, permanent tracheostomy and comes in because he feels like he has to force himself to move air in and out through his tracheostomy.  He states it is been going on for several months and he just got tired of having to work to breathe.  [facility] his [facility] for traumatic brain injury.  He denies fever or chills.  He denies cough.  Past Medical History:  Diagnosis Date  . Anxiety   . Dysphagia   . Hyperlipemia   . Hypertension   . Muscle weakness   . Stridor   . Subglottic stenosis   . [facility] ([facility]) [facility](HCC)    age 47    Patient Active Problem List   Diagnosis Date Noted  . Cough with hemoptysis 03/06/2017  . Tracheostomy complication (HCC) 03/06/2017  . Hemoptysis 03/06/2017    Past Surgical History:  Procedure Laterality Date  . TRACHEAL DILITATION N/A 09/14/2016   Procedure: TRACHEAL DILITATION; UPSIZE TRACH;  Surgeon: [facility], Jefry, MD;  [facility];  Service: ENT;  Laterality: N/A;  . TRACHEOSTOMY          Home Medications    Prior to Admission medications   Medication Sig Start Date End Date Taking? Authorizing Provider  acetaminophen (TYLENOL) 500 MG tablet Take 1,000 mg by mouth 2 (two) times daily as needed (for pain, headaches, or fever).    [provider]  albuterol (PROVENTIL) (2.5 MG/3ML) 0.083% nebulizer solution Take 3 mLs (2.5 mg total) by nebulization every 6 (six) hours. 12/27/16   The[facility], Theodore Marie, PA-C  ARIPiprazole (ABILIFY) 15 MG tablet Take 15 mg by mouth daily.    [provider]  atorvastatin (LIPITOR) 20 MG tablet Take 40  mg by mouth daily.     [provider]  clomiPRAMINE (ANAFRANIL) 50 MG capsule Take 100 mg by mouth daily.    [provider]  diphenhydrAMINE (BENADRYL) 25 mg capsule Take 25 mg by mouth at bedtime as needed for sleep.    [provider]  lithium carbonate 300 MG capsule Take 300 mg by mouth every morning.    [provider]  Melatonin 3 MG TABS Take 6 mg by mouth at bedtime.    [provider]  metoprolol succinate (TOPROL-XL) 100 MG 24 hr tablet Take 100 mg by mouth daily. Take with or immediately following a meal.    [provider]  OLANZapine (ZYPREXA) 5 MG tablet Take 5 mg by mouth 2 (two) times daily.    [provider]  Omega-3 Fatty Acids (FISH OIL) 1000 MG CAPS Take 1,000 mg by mouth daily.    [provider]  oxybutynin (DITROPAN) 5 MG tablet Take 5 mg by mouth daily.    [provider]  Respiratory Therapy Supplies (NEBULIZER) DEVI Use as directed  Please supply tubing and mask or T piece 12/27/16   Theodore Knox, Theodore Marie, PA-C  WELLNESS PROTEIN SHAKE PO Take 237 Containers by mouth 2 (two) times daily.    [provider]    Family History No family history on file.  Social History Social History   Tobacco Use  .  Smoking status: Never Smoker  . Smokeless tobacco: Never Used  Substance Use Topics  . Alcohol use: No  . Drug use: No     Allergies   Patient has no known allergies.   Review of Systems Review of Systems  All other systems reviewed and are negative.    Physical Exam Updated Vital Signs BP (!) 138/119 (BP Location: Left Arm)   Pulse 79   Temp 98.9 F (37.2 C) (Oral)   Resp 16   SpO2 95%   Physical Exam  Nursing note and vitals reviewed.  47 year old male, resting comfortably and in no acute distress. Vital signs are significant for elevated diastolic blood pressure. Oxygen saturation is 95%, which is normal. Head is normocephalic and atraumatic. PERRLA, EOMI.  Oropharynx is clear. Neck is nontender and supple without adenopathy or JVD.  Tracheostomy is in place. Back is nontender and there is no CVA tenderness. Lungs are clear without rales, wheezes, or rhonchi.  There is good air movement and no stridor. Chest is nontender. Heart has regular rate and rhythm without murmur. Abdomen is soft, flat, nontender without masses or hepatosplenomegaly and peristalsis is normoactive. Extremities have no cyanosis or edema, full range of motion is present. Skin is warm and dry without rash. Neurologic: Mental status is normal, cranial nerves are intact, there are no motor or sensory deficits.  ED Treatments / Results   Radiology Dg Chest 2 View  Result Date: 07/29/2017 CLINICAL DATA:  Cough and shortness of breath EXAM: CHEST - 2 VIEW COMPARISON:  Chest radiograph 03/05/2017 FINDINGS: The tracheostomy tube tip is 2.5 cm above the inferior margin of the carina. Normal cardiomediastinal contours. No pleural effusion or pneumothorax. Lungs are clear. IMPRESSION: No active cardiopulmonary disease. Electronically Signed   By: Theodore Knox M.D.   On: 07/29/2017 23:46    Procedures Procedures   Medications Ordered in ED Medications - No data to display   Initial Impression / Assessment and Plan / ED Course  I have reviewed the triage vital signs and the nursing notes.  Pertinent labs & imaging results that were available during my care of the patient were reviewed by me and considered in my medical decision making (see chart for details).  Subjective breathing difficulty with tracheostomy.  Objectively, it seems to be functioning normally.  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  We will try suctioning and check chest x-ray.  Old records are reviewed, and he has numerous ED visits for tracheostomy tube change.  While tube was placed in [place], he has seen Dr. Pollyann Kennedy of [facility] here in  [place].  He will be sent back to Dr. Pollyann Kennedy for follow-up.  Feels better after suction.  Problem may have been mucous plugging.  He is advised to suction his tracheostomy periodically.  Follow-up with ENT as noted above.  Final Clinical Impressions(s) / ED Diagnoses   Final diagnoses:  Difficulty breathing  Tracheostomy complication, unspecified complication type [facility])    ED Discharge Orders    None       Dione Booze, MD 07/30/17 [phone]

## 2017-07-29 NOTE — ED Notes (Signed)
Bed: WA04 Expected date:  Expected time:  Means of arrival:  Comments: 4446 m trach problems

## 2017-07-29 NOTE — ED Triage Notes (Signed)
[facility] with issues forcefully [facility].  Pt reports he is having a persistent blockage in his trach and is wanting some answers to better take care of his trach.

## 2017-07-30 NOTE — Discharge Instructions (Addendum)
Suction your tracheostomy as needed. Follow up with your ENT physician for issues related to your tracheostomy.

## 2017-07-30 NOTE — ED Notes (Signed)
[facility] called for transport.  

## 2017-08-25 IMAGING — CR DG CHEST 2V
2 series · 2 of 2 positions shown · non-contrast
Comparison: Chest radiograph performed 08/13/2016

CLINICAL DATA: Acute onset of cough and congestion. Initial
encounter.

EXAM:
CHEST  2 VIEW

[w chest pa]
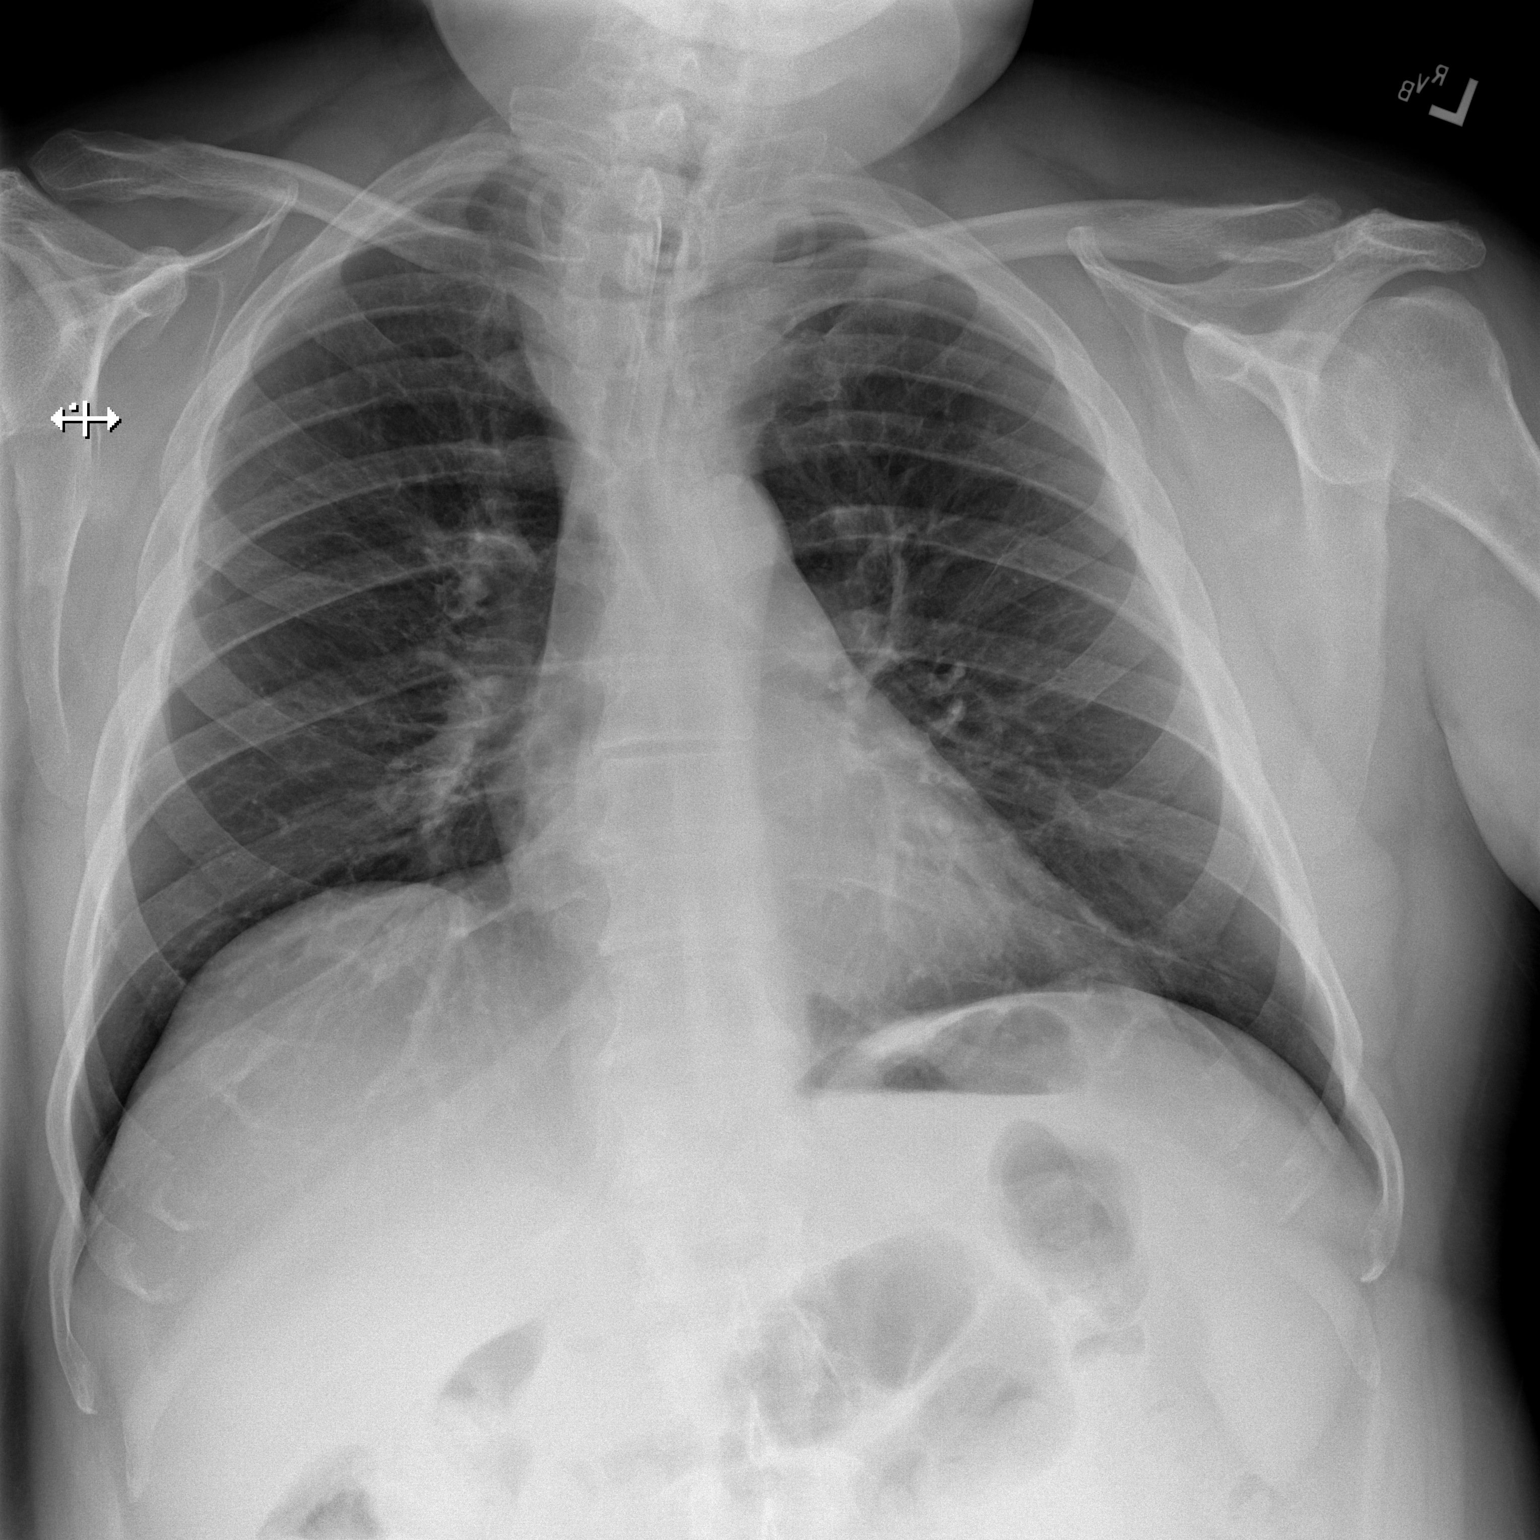

[w chest lat]
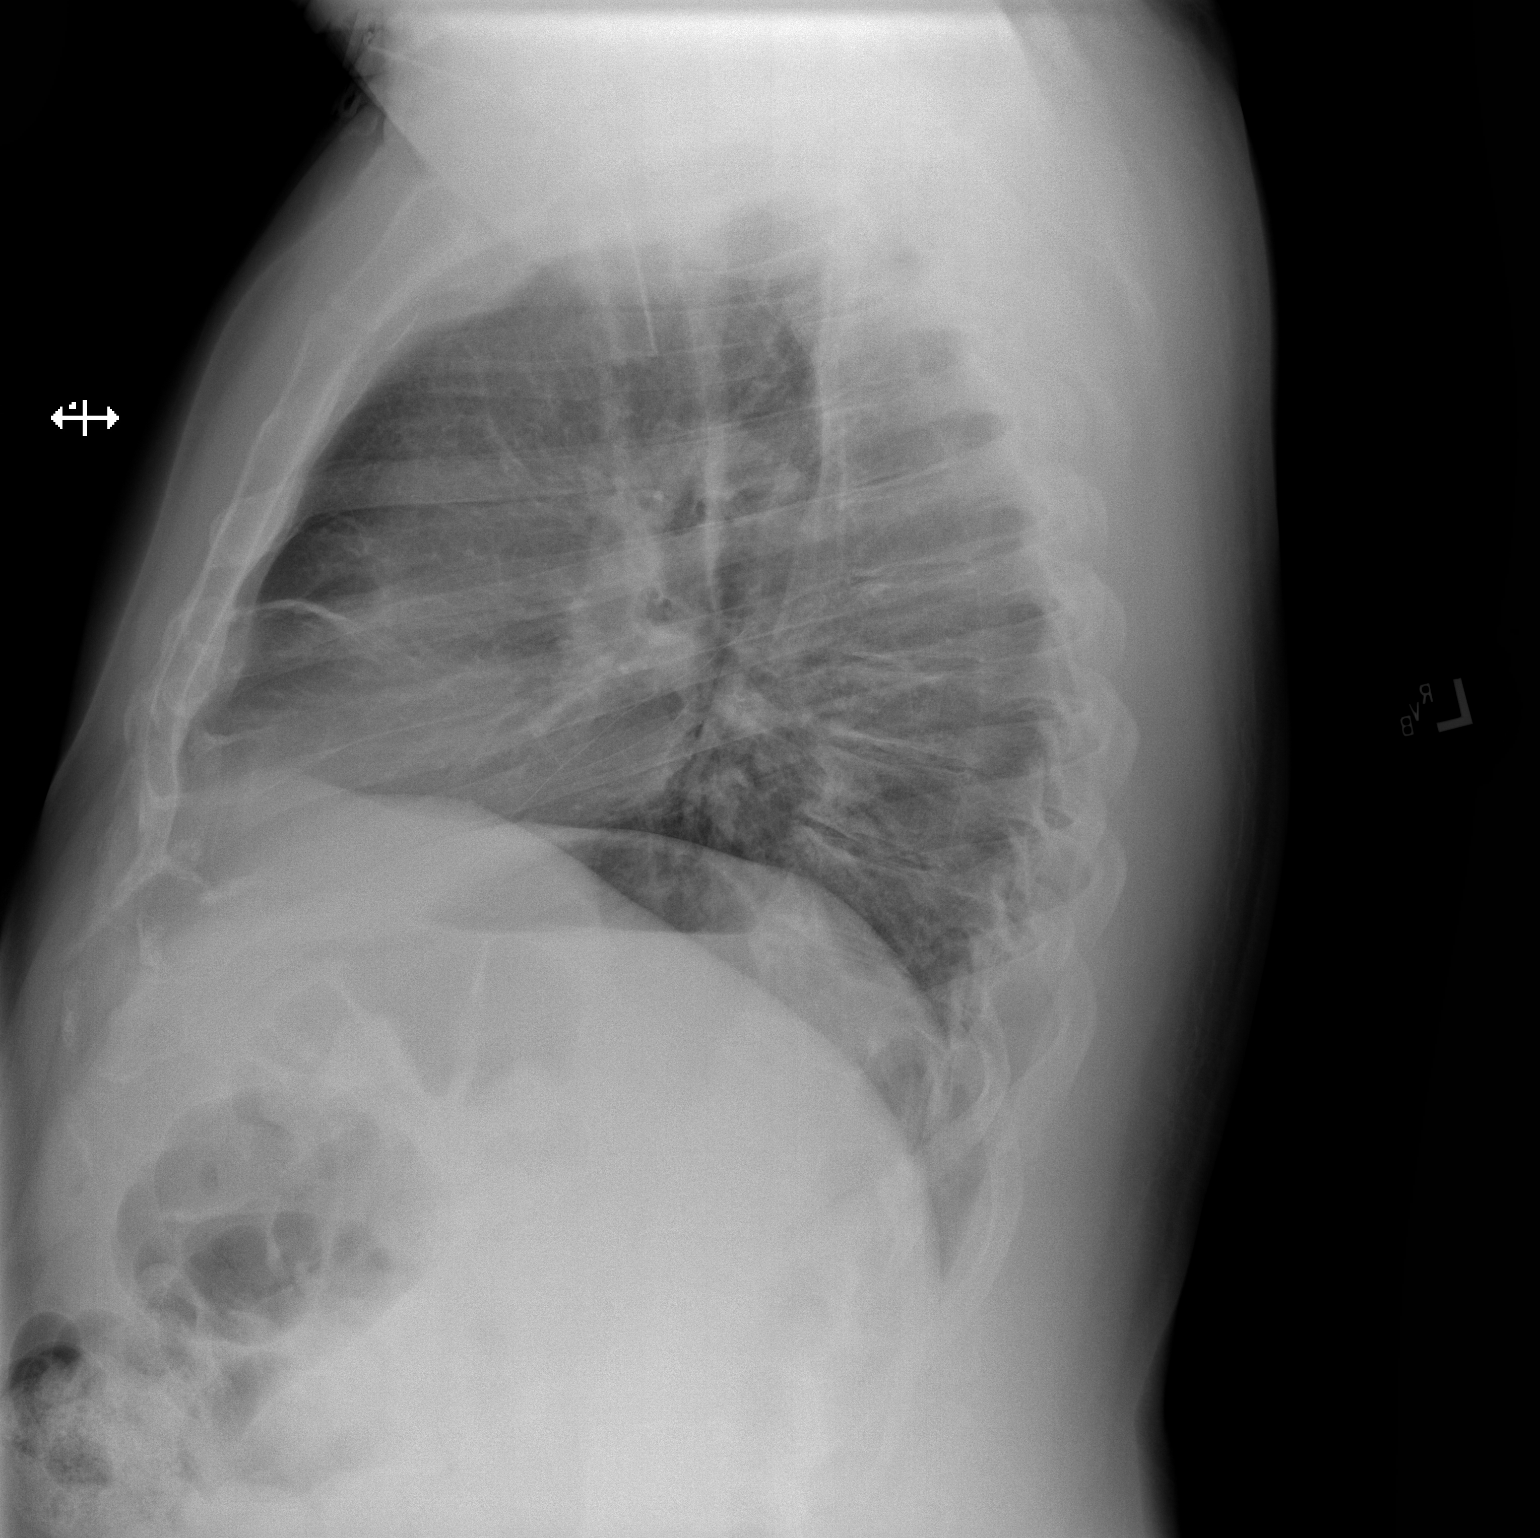

[2 of 2 positions shown; findings below may reference images not displayed]

FINDINGS: The lungs are well-aerated and clear. There is no evidence of focal
opacification, pleural effusion or pneumothorax.

The heart is normal in size; the mediastinal contour is within
normal limits. No acute osseous abnormalities are seen.
IMPRESSION: No acute cardiopulmonary process seen.

## 2017-10-17 ENCOUNTER — Emergency Department (HOSPITAL_COMMUNITY)
Admission: EM | Admit: 2017-10-17 | Discharge: 2017-10-18 | Disposition: A | Discharge status: Home / Self Care | Payer: Medicare Other | Attending: Emergency Medicine | Admitting: Emergency Medicine

## 2017-10-17 ENCOUNTER — Encounter (HOSPITAL_COMMUNITY): Payer: Self-pay | Admitting: Emergency Medicine

## 2017-10-17 ENCOUNTER — Other Ambulatory Visit: Payer: Self-pay

## 2017-10-17 DIAGNOSIS — J9509 Other tracheostomy complication: Secondary | ICD-10-CM | POA: Insufficient documentation

## 2017-10-17 DIAGNOSIS — Z79899 Other long term (current) drug therapy: Secondary | ICD-10-CM | POA: Insufficient documentation

## 2017-10-17 DIAGNOSIS — J9500 Unspecified tracheostomy complication: Secondary | ICD-10-CM

## 2017-10-17 DIAGNOSIS — Z430 Encounter for attention to tracheostomy: Secondary | ICD-10-CM | POA: Diagnosis present

## 2017-10-17 DIAGNOSIS — I10 Essential (primary) hypertension: Secondary | ICD-10-CM | POA: Insufficient documentation

## 2017-10-17 NOTE — ED Provider Notes (Signed)
[facility] EM[facility]ENCY [facility] Provider Note   CSN: [id number] Arrival date & time: 10/17/17  2301     History   Chief Complaint Chief Complaint  Patient presents with  . Tracheostomy Tube Change    HPI Theodore Knox is a 47 y.o. male.  Patient is a 47 year old male with history of traumatic brain injury with tracheostomy.  He presents today for evaluation of dislodged trach and bleeding from the trach site.  He states that he attempted to remove the cap off the inner cannula when he inadvertently dislodged the tracheostomy itself.  He had some bleeding from the site.  He was sent here for replacement.  He denies any complaints at present.  The history is provided by the patient.    Past Medical History:  Diagnosis Date  . Anxiety   . Dysphagia   . Hyperlipemia   . Hypertension   . Muscle weakness   . Stridor   . Subglottic stenosis   . TBI (traumatic brain injury) [facility](HCC)    age 47    Patient Active Problem List   Diagnosis Date Noted  . Cough with hemoptysis 03/06/2017  . Tracheostomy complication (HCC) 03/06/2017  . Hemoptysis 03/06/2017    Past Surgical History:  Procedure Laterality Date  . TRACHEAL DILITATION N/A 09/14/2016   Procedure: TRACHEAL DILITATION; UPSIZE TRACH;  Surgeon: Serena Colonelosen, Jefry, MD;  [facility];  Service: ENT;  Laterality: N/A;  . TRACHEOSTOMY          Home Medications    Prior to Admission medications   Medication Sig Start Date End Date Taking? Authorizing Provider  acetaminophen (TYLENOL) 500 MG tablet Take 1,000 mg by mouth 2 (two) times daily as needed (for pain, headaches, or fever).    [provider]  albuterol (PROVENTIL) (2.5 MG/3ML) 0.083% nebulizer solution Take 3 mLs (2.5 mg total) by nebulization every 6 (six) hours. 12/27/16   Bethel BornGekas, Kelly Marie, PA-C  ARIPiprazole (ABILIFY) 15 MG tablet Take 15 mg by mouth daily.    [provider]  atorvastatin (LIPITOR) 20 MG tablet Take 40 mg  by mouth daily.     [provider]  clomiPRAMINE (ANAFRANIL) 50 MG capsule Take 100 mg by mouth daily.    [provider]  diphenhydrAMINE (BENADRYL) 25 mg capsule Take 25 mg by mouth at bedtime as needed for sleep.    [provider]  lithium carbonate 300 MG capsule Take 300 mg by mouth every morning.    [provider]  Melatonin 3 MG TABS Take 6 mg by mouth at bedtime.    [provider]  metoprolol succinate (TOPROL-XL) 100 MG 24 hr tablet Take 100 mg by mouth daily. Take with or immediately following a meal.    [provider]  OLANZapine (ZYPREXA) 5 MG tablet Take 5 mg by mouth 2 (two) times daily.    [provider]  Omega-3 Fatty Acids (FISH OIL) 1000 MG CAPS Take 1,000 mg by mouth daily.    [provider]  oxybutynin (DITROPAN) 5 MG tablet Take 5 mg by mouth daily.    [provider]  Respiratory Therapy Supplies (NEBULIZER) DEVI Use as directed  Please supply tubing and mask or T piece 12/27/16   Bethel BornGekas, Kelly Marie, PA-C  WELLNESS PROTEIN SHAKE PO Take 237 Containers by mouth 2 (two) times daily.    [provider]    Family History History reviewed. No pertinent family history.  Social History Social History  Tobacco Use  . Smoking status: Never Smoker  . Smokeless tobacco: Never Used  Substance Use Topics  . Alcohol use: No  . Drug use: No     Allergies   Patient has no known allergies.   Review of Systems Review of Systems  All other systems reviewed and are negative.    Physical Exam Updated Vital Signs BP (!) 143/101   Pulse 73   Temp 98.1 F (36.7 C) (Oral)   Ht 5\' 7"  (1.702 m)   Wt 99.3 kg (219 lb)   SpO2 96%   BMI 34.30 kg/m   Physical Exam  Constitutional: He is oriented to person, place, and time. He appears well-developed and well-nourished. No distress.  HENT:  Head: Normocephalic and atraumatic.  Mouth/Throat: Oropharynx is clear and moist.    Neck: Normal range of motion. Neck supple.  The tracheostomy site appears well.  There is no redness or erythema.  There is no active bleeding.  Cardiovascular: Normal rate and regular rhythm. Exam reveals no friction rub.  No murmur heard. Pulmonary/Chest: Effort normal and breath sounds normal. No respiratory distress. He has no wheezes. He has no rales.  Abdominal: Soft. Bowel sounds are normal. He exhibits no distension. There is no tenderness.  Musculoskeletal: Normal range of motion. He exhibits no edema.  Neurological: He is alert and oriented to person, place, and time. Coordination normal.  Skin: Skin is warm and dry. He is not diaphoretic.  Nursing note and vitals reviewed.    ED Treatments / Results  Labs (all labs ordered are listed, but only abnormal results are displayed) Labs Reviewed - No data to display  EKG None  Radiology No results found.  Procedures Procedures (including critical care time)  Medications Ordered in ED Medications - No data to display   Initial Impression / Assessment and Plan / ED Course  I have reviewed the triage vital signs and the nursing notes.  Pertinent labs & imaging results that were available during my care of the patient were reviewed by me and considered in my medical decision making (see chart for details).  Patient presents with a dislodged tracheostomy tube.  Several attempts at replacement were unsuccessful.  I have spoken with Dr. Lazarus Salines from ENT who has come to the department to evaluate.  He has replaced the tracheostomy tube and the patient will be discharged.  Final Clinical Impressions(s) / ED Diagnoses   Final diagnoses:  None    ED Discharge Orders    None       Geoffery Lyons, MD 10/18/17 0120

## 2017-10-17 NOTE — ED Triage Notes (Signed)
[facility] EMS[facility]. EMS reports pt pulled tracheostomy tube out when he was attempting to pull out the cap. EMS reports bleeding at the site on scene, bleeding controlled at this time. Per ems 0/10 pain and no other complaints. AOx4.

## 2017-10-18 DIAGNOSIS — J9509 Other tracheostomy complication: Secondary | ICD-10-CM | POA: Diagnosis not present

## 2017-10-18 MED ORDER — LIDOCAINE HCL (PF) 1 % IJ SOLN
30.0000 mL | Freq: Once | INTRAMUSCULAR | Status: AC
  Administered 2017-10-18: 30 mL
  Filled 2017-10-18: qty 30

## 2017-10-18 NOTE — Progress Notes (Signed)
Trach reinserted by ENT. RT performed trach care, got pt a soda (okay by MD), and placed extra #6 Shiley XLT cfls in belongings bag for pt with extra trach ties.

## 2017-10-18 NOTE — Discharge Instructions (Addendum)
Follow-up with your ENT if you experience additional problems.

## 2017-10-18 NOTE — Consult Note (Signed)
Theodore Knox,  Aasir 47 y.o., male [id number]     Chief Complaint: trach dislodged  HPI: 47 yo wm, longstanding tracheostomy.  Accidentally decannulated himself this evening trying to get his Shelbie Hutchingassey Muir valve off.  Came to ED.  Unable to replace. ENT called for assistance.    PMH: Past Medical History:  Diagnosis Date  . Anxiety   . Dysphagia   . Hyperlipemia   . Hypertension   . Muscle weakness   . Stridor   . Subglottic stenosis   . TBI (traumatic brain injury) [facility](HCC)    age 47    Surg Hx: Past Surgical History:  Procedure Laterality Date  . TRACHEAL DILITATION N/A 09/14/2016   Procedure: TRACHEAL DILITATION; UPSIZE TRACH;  Surgeon: Serena Colonelosen, Jefry, MD;  [facility];  Service: ENT;  Laterality: N/A;  . TRACHEOSTOMY      FHx:  History reviewed. No pertinent family history. SocHx:  reports that he has never smoked. He has never used smokeless tobacco. He reports that he does not drink alcohol or use drugs.  ALLERGIES: No Known Allergies   (Not in a hospital admission)  No results found for this or any previous visit (from the past 48 hour(s)). No results found.    Blood pressure (!) 143/101, pulse 73, temperature 98.1 F (36.7 C), temperature source Oral, height 5\' 7"  (1.702 m), weight 99.3 kg (219 lb), SpO2 96 %.  PHYSICAL EXAM: Overall appearance: alert.  Janina Mayorach out.  Not dyspneic. Head:  NCAT Ears:  Not examined. Nose:  Not examined Oral Cavity:  Not examined Oral Pharynx/Hypopharynx/Larynx:  Not examined Neuro:  Grossly intact Neck:  Granulation.  Bubbling air from tract.        Assessment/Plan Trach dislodgement.  Unable to replace.  With informed consent, I anesthetized the peristomal area with 1% xylocaine with 1:100,000 epi., 10 ml total.  Able to insert the 3.8 mm flexible laryngoscope.  Using nasal speculum, stomal tract visualized.  Shiley 6XLT cuffless placed successfully.  ED was unable to find stainless steel trach tubes to use for dilation.   Pt  tol well.  Flex scope per trach tube reveals presence in lumen.  Passey Muir valve placed with good function.    Recheck my office as needed. Flo ShanksWOLICKI, Aishah Teffeteller 10/18/2017, 1:08 AM

## 2017-10-18 NOTE — ED Notes (Signed)
[facility] transferred [facility] during [facility]. Paper transfer med necessity sent with pt. Report given to [facility] and to [facility]. Pt verbalized understanding off all reviewed discharge materials with no further question at this time.

## 2017-12-07 IMAGING — DX DG HIP (WITH OR WITHOUT PELVIS) 2-3V*R*
3 series · 3 of 3 positions shown · non-contrast
Comparison: None.

CLINICAL DATA: Right hip pain with standing and ambulation.
Symptoms began last night.

EXAM:
DG HIP (WITH OR WITHOUT PELVIS) 2-3V RIGHT

[pelvis ap]
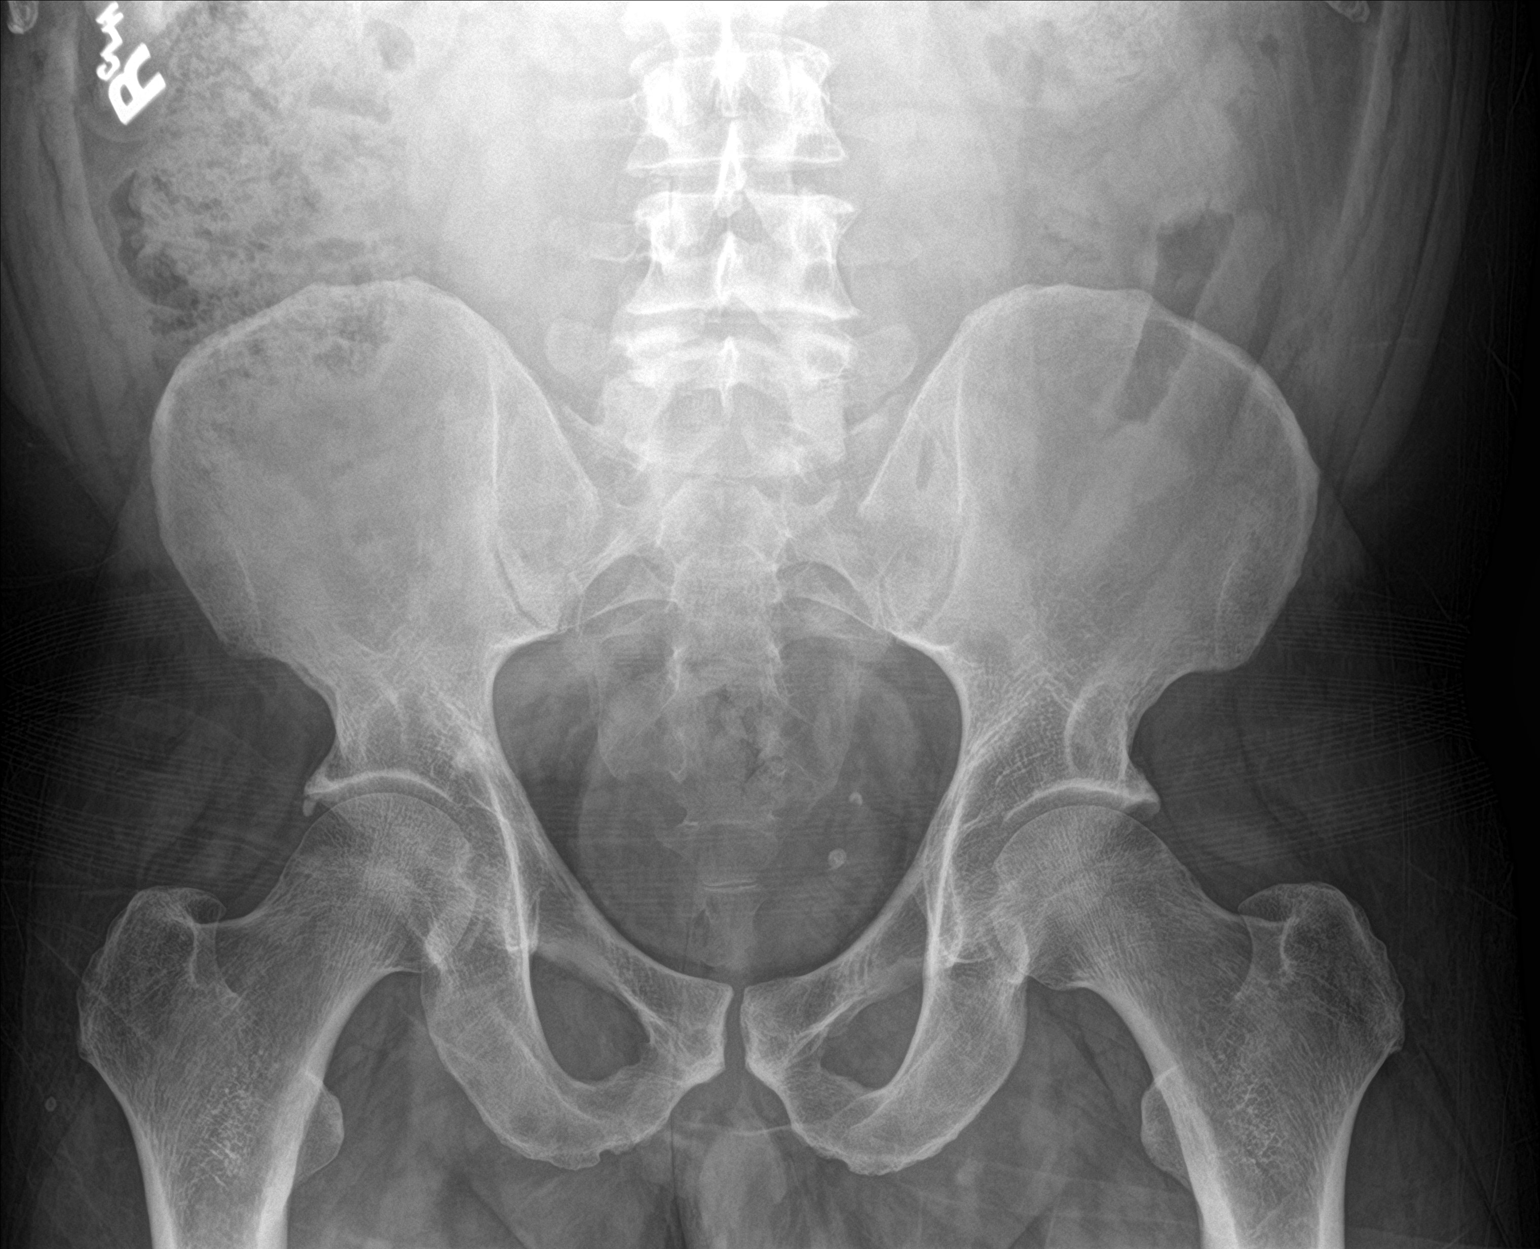

[hip ap]
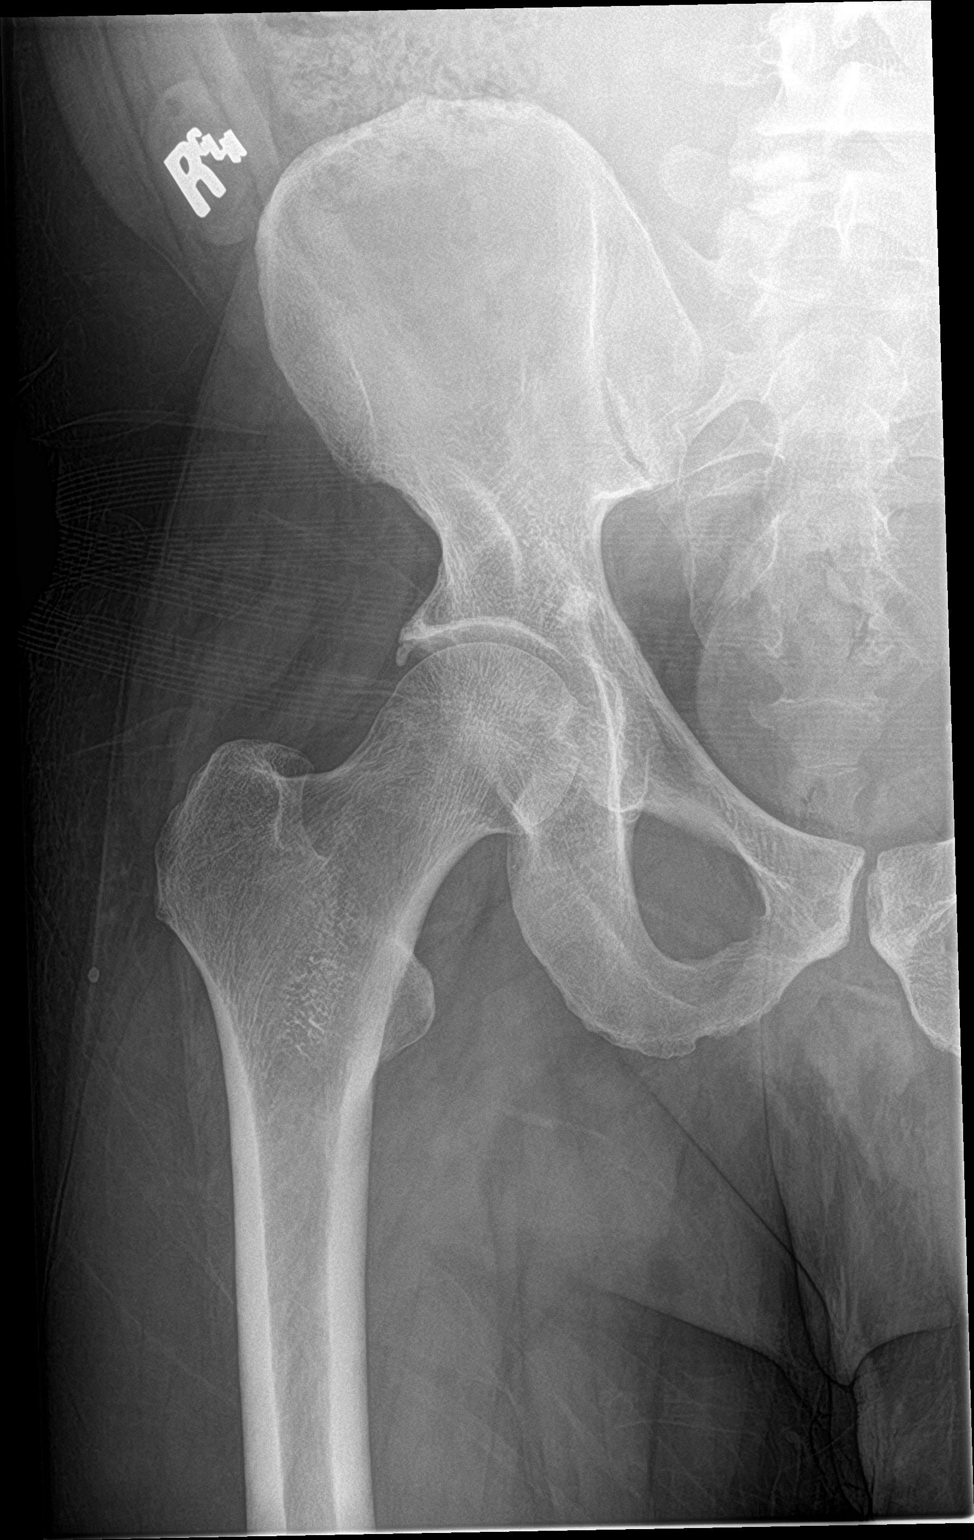

[hip lat]
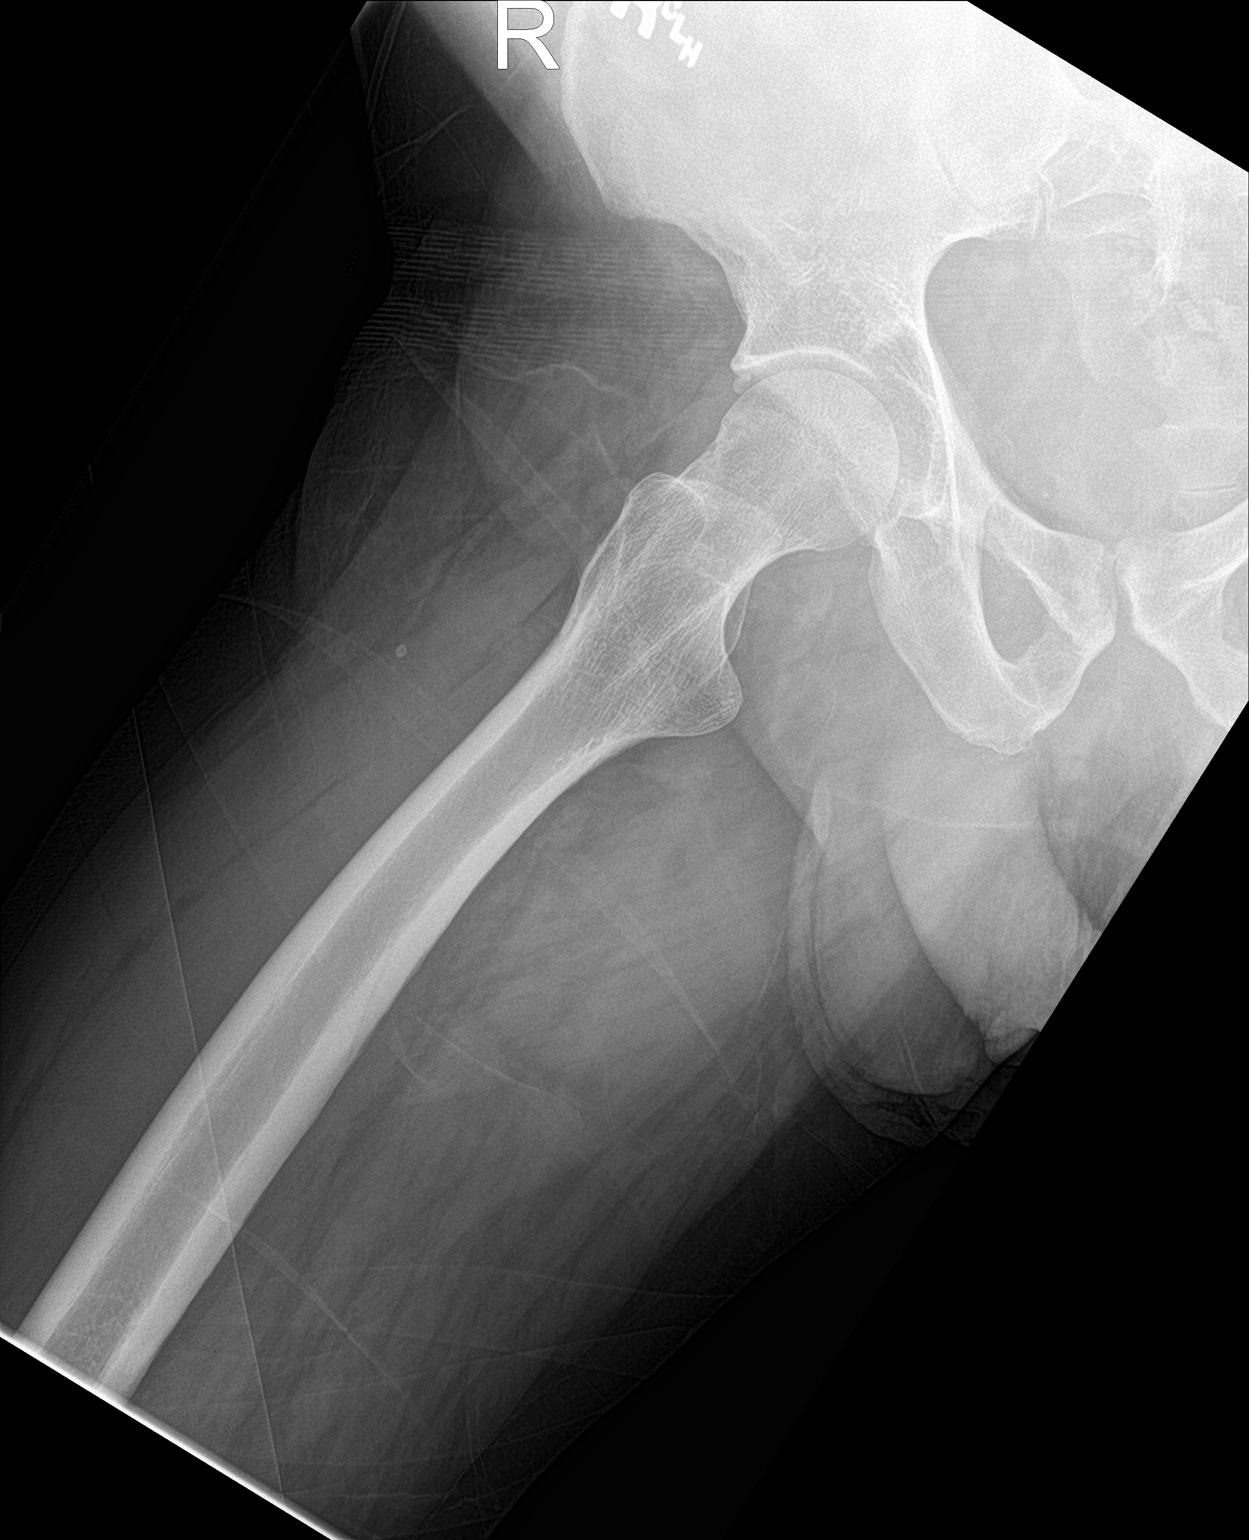

[3 of 3 positions shown; findings below may reference images not displayed]

FINDINGS: There is no evidence of hip fracture or dislocation. There is no
evidence of arthropathy or other focal bone abnormality.
IMPRESSION: Negative.
